# Patient Record
Sex: Male | Born: 1935 | Race: White | Hispanic: No | Smoking: Former smoker
Health system: Southern US, Community
[De-identification: ages and names within clinical notes are randomized; demographics above are authoritative.]

## PROBLEM LIST (undated history)

## (undated) DIAGNOSIS — I8501 Esophageal varices with bleeding: Secondary | ICD-10-CM

## (undated) DIAGNOSIS — K7460 Unspecified cirrhosis of liver: Secondary | ICD-10-CM

## (undated) DIAGNOSIS — K3189 Other diseases of stomach and duodenum: Secondary | ICD-10-CM

## (undated) DIAGNOSIS — K766 Portal hypertension: Secondary | ICD-10-CM

## (undated) DIAGNOSIS — I639 Cerebral infarction, unspecified: Secondary | ICD-10-CM

## (undated) DIAGNOSIS — N189 Chronic kidney disease, unspecified: Secondary | ICD-10-CM

## (undated) DIAGNOSIS — I219 Acute myocardial infarction, unspecified: Secondary | ICD-10-CM

## (undated) DIAGNOSIS — I10 Essential (primary) hypertension: Secondary | ICD-10-CM

## (undated) HISTORY — DX: Other diseases of stomach and duodenum: K31.89

## (undated) HISTORY — DX: Portal hypertension: K76.6

---

## 1990-04-05 DIAGNOSIS — I639 Cerebral infarction, unspecified: Secondary | ICD-10-CM

## 1990-04-05 HISTORY — DX: Cerebral infarction, unspecified: I63.9

## 1990-04-05 HISTORY — PX: CORONARY ARTERY BYPASS GRAFT: SHX141

## 2001-08-31 ENCOUNTER — Ambulatory Visit (HOSPITAL_BASED_OUTPATIENT_CLINIC_OR_DEPARTMENT_OTHER): Admission: RE | Admit: 2001-08-31 | Discharge: 2001-08-31 | Payer: Self-pay | Admitting: Urology

## 2002-05-15 ENCOUNTER — Ambulatory Visit (HOSPITAL_COMMUNITY): Admission: RE | Admit: 2002-05-15 | Discharge: 2002-05-15 | Payer: Self-pay | Admitting: Cardiology

## 2002-05-15 ENCOUNTER — Encounter: Payer: Self-pay | Admitting: Cardiology

## 2002-09-05 ENCOUNTER — Emergency Department (HOSPITAL_COMMUNITY): Admission: EM | Admit: 2002-09-05 | Discharge: 2002-09-06 | Payer: Self-pay | Admitting: Emergency Medicine

## 2002-09-13 ENCOUNTER — Emergency Department (HOSPITAL_COMMUNITY): Admission: EM | Admit: 2002-09-13 | Discharge: 2002-09-13 | Payer: Self-pay | Admitting: Emergency Medicine

## 2002-11-02 ENCOUNTER — Encounter: Payer: Self-pay | Admitting: Cardiology

## 2002-11-02 ENCOUNTER — Encounter: Admission: RE | Admit: 2002-11-02 | Discharge: 2002-11-02 | Payer: Self-pay | Admitting: Cardiology

## 2002-11-07 ENCOUNTER — Ambulatory Visit (HOSPITAL_COMMUNITY): Admission: RE | Admit: 2002-11-07 | Discharge: 2002-11-07 | Payer: Self-pay | Admitting: Cardiology

## 2004-10-27 ENCOUNTER — Ambulatory Visit (HOSPITAL_COMMUNITY): Admission: RE | Admit: 2004-10-27 | Discharge: 2004-10-27 | Payer: Self-pay | Admitting: Urology

## 2004-12-24 ENCOUNTER — Ambulatory Visit (HOSPITAL_COMMUNITY): Admission: RE | Admit: 2004-12-24 | Discharge: 2004-12-24 | Payer: Self-pay | Admitting: Urology

## 2005-01-05 ENCOUNTER — Ambulatory Visit: Payer: Self-pay | Admitting: Internal Medicine

## 2005-01-14 ENCOUNTER — Ambulatory Visit: Payer: Self-pay | Admitting: Cardiology

## 2005-08-02 ENCOUNTER — Ambulatory Visit (HOSPITAL_COMMUNITY): Admission: RE | Admit: 2005-08-02 | Discharge: 2005-08-02 | Payer: Self-pay | Admitting: Urology

## 2006-01-03 ENCOUNTER — Ambulatory Visit: Payer: Self-pay | Admitting: Internal Medicine

## 2008-10-14 ENCOUNTER — Ambulatory Visit: Payer: Self-pay | Admitting: Internal Medicine

## 2008-10-14 DIAGNOSIS — J449 Chronic obstructive pulmonary disease, unspecified: Secondary | ICD-10-CM

## 2008-10-14 DIAGNOSIS — I219 Acute myocardial infarction, unspecified: Secondary | ICD-10-CM | POA: Insufficient documentation

## 2008-10-14 DIAGNOSIS — J309 Allergic rhinitis, unspecified: Secondary | ICD-10-CM | POA: Insufficient documentation

## 2008-10-14 DIAGNOSIS — I2510 Atherosclerotic heart disease of native coronary artery without angina pectoris: Secondary | ICD-10-CM | POA: Insufficient documentation

## 2008-10-14 DIAGNOSIS — J45909 Unspecified asthma, uncomplicated: Secondary | ICD-10-CM | POA: Insufficient documentation

## 2008-10-14 DIAGNOSIS — E119 Type 2 diabetes mellitus without complications: Secondary | ICD-10-CM | POA: Insufficient documentation

## 2008-10-21 ENCOUNTER — Telehealth (INDEPENDENT_AMBULATORY_CARE_PROVIDER_SITE_OTHER): Payer: Self-pay | Admitting: *Deleted

## 2010-08-21 NOTE — Op Note (Signed)
NAME:  Alex Wise, Alex Wise                ACCOUNT NO.:  [id number]   MEDICAL RECORD NO.:  [id number]          PATIENT TYPE:  AMB   LOCATION:  DAY                          FACILITY:  [facility]   PHYSICIAN:  Excell Seltzer. Annabell Howells, M.D.    DATE OF BIRTH:  [birth year]   DATE OF PROCEDURE:  10/27/2004  DATE OF DISCHARGE:                                 OPERATIVE REPORT   PROCEDURE:  Circumcision.   PREOPERATIVE DIAGNOSIS:  Phimosis with balanitis.   POSTOPERATIVE DIAGNOSIS:  Phimosis with balanitis.   SURGEON:  Dr. Bjorn Pippin.   ANESTHESIA:  MAC and local.   SPECIMEN.:  None.   COMPLICATIONS:  None.   INDICATIONS:  [name] is a 77 year old white male diabetic who presented  with progressive phimosis and balanitis. It was felt that circumcision was  indicated.   FINDINGS AND PROCEDURE:  The patient was given a gram of Ancef, he was taken  to the operating room, replaced on the table in supine position. IV sedation  was given, his genitalia was prepped with Betadine solution, he was draped  in the usual sterile fashion. A penile block was performed with  approximately 20 mL of 50/50 mixture 1% lidocaine and half percent Marcaine.  He then underwent a dorsal and ventral slit followed by resection of the  redundant wings of foreskin. Hemostasis was achieved with the Bovie and the  skin edges were reapproximated using a running 4-0 chromic locked in the  quadrants. The penis was cleaned and a dressing of Xeroform, Kling and Coban  was applied. The patient was taken to the recovery room in stable condition.  There were no complications.       JJW/MEDQ  D:  10/27/2004  T:  10/27/2004  Job:  [id number]   cc:   Barry Dienes. Eloise Harman, M.D.  [address]  [place]  [place] [address]  Fax: [phone]   Dorisann Frames, M.D.  [phone] [address] [place] [address]  Fax: [phone]   Thereasa Solo. Little, M.D.  [address]  [address]  [place]  [place] [address]  Fax: [phone]

## 2010-08-21 NOTE — Cardiovascular Report (Signed)
NAME:  Alex, Wise                          ACCOUNT NO.:  [id number]   MEDICAL RECORD NO.:  [id number]                   PATIENT TYPE:  OIB   LOCATION:  2899                                 FACILITY:  [facility]   PHYSICIAN:  Thereasa Solo. Little, M.D.              DATE OF BIRTH:  [birth year]   DATE OF PROCEDURE:  11/07/2002  DATE OF DISCHARGE:                              CARDIAC CATHETERIZATION   INDICATIONS:  Alex Wise is a 77 year old male who had bypass surgery  performed by Alex Wise. Alex Wise, M.D., in 1993.  He presented about eight  months ago with recurrent angina, refused any type of evaluation, finally  had a stress test performed that showed inferior lateral ischemia about four  months ago.  He refused to have a catheterization done and then began having  recurrent episodes of exertional angina, has new T-wave inversion in the  lateral leads, and comes in four an outpatient cardiac catheterization.   DESCRIPTION OF PROCEDURE:  The patient was prepped and draped in the usual  sterile fashion, exposing the right groin.  Following local anesthetic of 1%  xylocaine, the Seldinger technique was employed and a 5-French introducer  sheath was placed into the right femoral artery.  A Glidewire and a right  coronary catheter were used to navigate the descending aorta.  Evaluation of  the grafts using the right coronary artery right Judkins catheter was  performed.  A Williams right had to be used to cannulate the right coronary  artery, internal mammary artery, an IMA catheter was used to cannulate the  left internal mammary, and a standard Judkins left 4 was used for selective  left coronary evaluation.  Ventriculography and a distal aortogram were  performed.   Total contrast 245 mL.   COMPLICATIONS:  None.   HEMODYNAMIC MONITORING:  1. Central aortic pressure was 140/79.  2. Left ventricle pressure was 145/7 with no significant aortic valve     gradient noted.   VENTRICULOGRAPHY:  Ventriculography in the RAO projection revealed posterior  basilar segment to be akinetic.  The inferior wall was mildly hypokinetic  and the anterior and apical segments showed normal wall motion.  Ejection  fraction calculated at 48%.  The end diastolic pressure was 15.   DISTAL AORTOGRAM:  Because of mild irregularities noted throughout the  distal aorta as I tried to pass the catheters, the distal aortogram was  performed.  There were mild irregularities in the distal aorta.  There was  no abdominal aortic aneurysm.  There was no significant iliac disease.  The  left renal artery had an area of 30% narrowing.   CORONARY ARTERIOGRAPHY:  Dense calcification in the entire LAD, left main  was noted on fluoroscopy.   1. Left main: There was ostial 50% narrowing of the left main with the     distal showing an eccentric 80% area of narrowing.  2. Circumflex: The  circumflex in the AV groove was very torturous.  There     was one small marginal vessel noted that paralled the circumflex and the     AV groove.  It, too, was tortuous, small in diameter, and had mild     irregularities.  I could not tell if this was a grafted vessel.  I saw no     evidence of bidirectional flow.  The other OM, however, that was faintly     visualized clearly was grafted and had some bidirectional flow.  3. LAD: The LAD had a long area of 70-90% narrowing in its proximal segment.     The LAD was 100% occluded after a septal perforator with some faint     bidirectional flow.  The first septal perforator had a 60% area of ostial     narrowing.  There was a large left to right collateral bed noted with the     PDA being well visualized via the collaterals.   GRAFTS:  1. Saphenous vein graft to the PDA, RCA, and acute marginal vessel was 100%     occluded in its ostium.  2. Saphenous vein graft to the intermediate OM vessel: The graft was widely     patent with mild irregularities at the distal  portion.  The OM and     intermediate vessels were both small with diffuse proximal irregularities     and a small OM vessel.  3. Internal mammary artery to the LAD and diagonal: The internal mammary     artery was tortuous.  There was no high grade stenosis.  The LAD and     diagonals had mild irregularities and the diagonal, itself, was small.   CONCLUSIONS:  1. Severe native disease not amenable to any type of intervention.  2. Loss of one of the three bypass grafts which supply the inferior wall.     There is clearly an inferior wall motion abnormality present; however,     there is a collateral bed to the patent ductus arteriosus from the left     coronary circulation.   PLAN:  At this point, the patient needs aggressive medical therapy and  perhaps, EECP.  There is nothing that can be done from a percutaneous  standpoint.                                               Thereasa Solo. Little, M.D.    ABL/MEDQ  D:  11/07/2002  T:  11/07/2002  Job:  [id number]

## 2011-07-20 ENCOUNTER — Encounter (HOSPITAL_COMMUNITY): Payer: Self-pay | Admitting: *Deleted

## 2011-07-20 ENCOUNTER — Emergency Department (HOSPITAL_COMMUNITY): Payer: Medicare Other

## 2011-07-20 ENCOUNTER — Emergency Department (HOSPITAL_COMMUNITY)
Admission: EM | Admit: 2011-07-20 | Discharge: 2011-07-20 | Disposition: A | Discharge status: Home / Self Care | Payer: Medicare Other | Attending: Emergency Medicine | Admitting: Emergency Medicine

## 2011-07-20 DIAGNOSIS — I10 Essential (primary) hypertension: Secondary | ICD-10-CM | POA: Insufficient documentation

## 2011-07-20 DIAGNOSIS — Z8673 Personal history of transient ischemic attack (TIA), and cerebral infarction without residual deficits: Secondary | ICD-10-CM | POA: Insufficient documentation

## 2011-07-20 DIAGNOSIS — E119 Type 2 diabetes mellitus without complications: Secondary | ICD-10-CM | POA: Insufficient documentation

## 2011-07-20 DIAGNOSIS — Z794 Long term (current) use of insulin: Secondary | ICD-10-CM | POA: Insufficient documentation

## 2011-07-20 DIAGNOSIS — R51 Headache: Secondary | ICD-10-CM | POA: Insufficient documentation

## 2011-07-20 DIAGNOSIS — J45909 Unspecified asthma, uncomplicated: Secondary | ICD-10-CM | POA: Insufficient documentation

## 2011-07-20 DIAGNOSIS — M542 Cervicalgia: Secondary | ICD-10-CM | POA: Insufficient documentation

## 2011-07-20 HISTORY — DX: Cerebral infarction, unspecified: I63.9

## 2011-07-20 HISTORY — DX: Essential (primary) hypertension: I10

## 2011-07-20 MED ORDER — ACETAMINOPHEN 500 MG PO TABS: 500.0000 mg | ORAL_TABLET | Freq: Four times a day (QID) | ORAL | Status: AC | PRN

## 2011-07-20 MED ORDER — CYCLOBENZAPRINE HCL 10 MG PO TABS: 10.0000 mg | ORAL_TABLET | Freq: Two times a day (BID) | ORAL | Status: AC | PRN
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
## 2011-07-20 NOTE — ED Notes (Signed)
The pt is c/o the wait threatening to leave if he is not seen.  He attempts to joke then becomes angry
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
## 2011-07-21 NOTE — ED Provider Notes (Signed)
Medical screening examination/treatment/procedure(s) were performed by non-physician practitioner and as supervising physician I was immediately available for consultation/collaboration.   Dione Booze, MD 07/21/11 [phone]

## 2012-03-26 ENCOUNTER — Inpatient Hospital Stay (HOSPITAL_COMMUNITY)
Admission: EM | Admit: 2012-03-26 | Discharge: 2012-03-30 | DRG: 441 | Disposition: A | Discharge status: Home / Self Care | Payer: Medicare Other | Attending: Internal Medicine | Admitting: Internal Medicine

## 2012-03-26 ENCOUNTER — Encounter (HOSPITAL_COMMUNITY): Payer: Self-pay

## 2012-03-26 DIAGNOSIS — J449 Chronic obstructive pulmonary disease, unspecified: Secondary | ICD-10-CM

## 2012-03-26 DIAGNOSIS — Z951 Presence of aortocoronary bypass graft: Secondary | ICD-10-CM

## 2012-03-26 DIAGNOSIS — I2510 Atherosclerotic heart disease of native coronary artery without angina pectoris: Secondary | ICD-10-CM | POA: Diagnosis present

## 2012-03-26 DIAGNOSIS — J45909 Unspecified asthma, uncomplicated: Secondary | ICD-10-CM | POA: Diagnosis present

## 2012-03-26 DIAGNOSIS — I8511 Secondary esophageal varices with bleeding: Secondary | ICD-10-CM | POA: Diagnosis present

## 2012-03-26 DIAGNOSIS — D62 Acute posthemorrhagic anemia: Secondary | ICD-10-CM | POA: Diagnosis present

## 2012-03-26 DIAGNOSIS — D696 Thrombocytopenia, unspecified: Secondary | ICD-10-CM | POA: Diagnosis present

## 2012-03-26 DIAGNOSIS — E119 Type 2 diabetes mellitus without complications: Secondary | ICD-10-CM | POA: Diagnosis present

## 2012-03-26 DIAGNOSIS — K7460 Unspecified cirrhosis of liver: Secondary | ICD-10-CM | POA: Diagnosis present

## 2012-03-26 DIAGNOSIS — I252 Old myocardial infarction: Secondary | ICD-10-CM

## 2012-03-26 DIAGNOSIS — K766 Portal hypertension: Principal | ICD-10-CM | POA: Diagnosis present

## 2012-03-26 DIAGNOSIS — I8501 Esophageal varices with bleeding: Secondary | ICD-10-CM | POA: Diagnosis present

## 2012-03-26 DIAGNOSIS — I219 Acute myocardial infarction, unspecified: Secondary | ICD-10-CM

## 2012-03-26 DIAGNOSIS — K922 Gastrointestinal hemorrhage, unspecified: Secondary | ICD-10-CM | POA: Diagnosis present

## 2012-03-26 LAB — OCCULT BLOOD, POC DEVICE: Fecal Occult Bld: POSITIVE — AB

## 2012-03-26 LAB — COMPREHENSIVE METABOLIC PANEL
Alkaline Phosphatase: 50 U/L (ref 39–117)
BUN: 66 mg/dL — ABNORMAL HIGH (ref 6–23)
CO2: 21 mEq/L (ref 19–32)
Chloride: 97 mEq/L (ref 96–112)
Creatinine, Ser: 1.1 mg/dL (ref 0.50–1.35)
GFR calc Af Amer: 72 mL/min — ABNORMAL LOW (ref >90)
GFR calc non Af Amer: 62 mL/min — ABNORMAL LOW (ref >90)
Glucose, Bld: 292 mg/dL — ABNORMAL HIGH (ref 70–99)
Total Bilirubin: 0.9 mg/dL (ref 0.3–1.2)

## 2012-03-26 MED ORDER — SODIUM CHLORIDE 0.9 % IV BOLUS (SEPSIS)
1000.0000 mL | Freq: Once | INTRAVENOUS | Status: AC
  Administered 2012-03-26: 1000 mL via INTRAVENOUS

## 2012-03-26 MED ORDER — SODIUM CHLORIDE 0.9 % IV SOLN
80.0000 mg | Freq: Once | INTRAVENOUS | Status: AC
  Administered 2012-03-26: 80 mg via INTRAVENOUS
  Filled 2012-03-26: qty 80

## 2012-03-26 NOTE — ED Notes (Signed)
Bed:WA15<BR> Expected date:<BR> Expected time:<BR> Means of arrival:<BR> Comments:<BR> EMS
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
## 2012-03-26 NOTE — ED Provider Notes (Signed)
History     CSN: [id number]  Arrival date & time 03/26/12  2142   First MD Initiated Contact with Patient 03/26/12 2208      Chief Complaint  Patient presents with  . GI Bleeding   HPI  History provided by the patient. Patient is a 78 year old male with history of hypertension and diabetes who presents with complaints of dark black stool and episodes of vomiting with hematemesis. Patient reports having 2 dark black stools earlier today and later this afternoon and evening developed nausea vomiting. He reports having dark red blood clots in the emesis. Symptoms have been associated with some mild epigastric pain and burning. Patient denies having similar symptoms previously. Patient denies any current use of blood thinners or aspirin. Denies any recent reflux symptoms. Denies any belching. Symptoms have been associated with some lightheadedness and chills. Denies any fever, diaphoresis or syncope. Denies any chest pain or shortness of breath.   Past Medical History  Diagnosis Date  . Diabetes mellitus   . Hypertension   . Stroke   . Asthma     History reviewed. No pertinent past surgical history.  History reviewed. No pertinent family history.  History  Substance Use Topics  . Smoking status: Never Smoker   . Smokeless tobacco: Not on file  . Alcohol Use: No      Review of Systems  Constitutional: Positive for chills. Negative for fever and fatigue.  Respiratory: Negative for shortness of breath.   Cardiovascular: Negative for chest pain.  Gastrointestinal: Positive for nausea, vomiting, abdominal pain and blood in stool. Negative for diarrhea, constipation and rectal pain.  Neurological: Positive for light-headedness.  All other systems reviewed and are negative.    Allergies  Codeine  Home Medications   Current Outpatient Rx  Name  Route  Sig  Dispense  Refill  . VITAMIN D3 2000 UNITS PO TABS   Oral   Take 1 tablet by mouth daily.         . INSULIN  ISOPHANE & REGULAR (70-30) 100 UNIT/ML [place] SUSP   Subcutaneous   Inject 50 Units into the skin 2 (two) times daily with a meal.           BP 126/54  Pulse 114  Temp 98.8 F (37.1 C) (Oral)  Resp 16  SpO2 93%  Physical Exam  Nursing note and vitals reviewed. Constitutional: He is oriented to person, place, and time. He appears well-developed and well-nourished. No distress.  HENT:  Head: Normocephalic.       Oral mucosa dry  Cardiovascular: Regular rhythm.  Tachycardia present.   No murmur heard. Pulmonary/Chest: Effort normal and breath sounds normal. No respiratory distress. He has no wheezes. He has no rales.  Abdominal: Soft. There is no tenderness. There is no rebound and no guarding.  Genitourinary: Guaiac positive stool.  Neurological: He is alert and oriented to person, place, and time.  Skin: Skin is warm and dry. There is pallor.  Psychiatric: He has a normal mood and affect. His behavior is normal.    ED Course  Procedures   Results for orders placed during the hospital encounter of 03/26/12  CBC WITH DIFFERENTIAL      Component Value Range   WBC 13.8 (*) 4.0 - 10.5 K/uL   RBC 3.43 (*) 4.22 - 5.81 MIL/uL   Hemoglobin 10.9 (*) 13.0 - 17.0 g/dL   HCT 40.9 (*) 81.1 - 91.4 %   MCV 93.3  78.0 - 100.0 fL  MCH 31.8  26.0 - 34.0 pg   MCHC 34.1  30.0 - 36.0 g/dL   RDW 14.7  82.9 - 56.2 %   Platelets 108 (*) 150 - 400 K/uL   Neutrophils Relative 80 (*) 43 - 77 %   Lymphocytes Relative 15  12 - 46 %   Monocytes Relative 4  3 - 12 %   Eosinophils Relative 0  0 - 5 %   Basophils Relative 1  0 - 1 %   Neutro Abs 11.0 (*) 1.7 - 7.7 K/uL   Lymphs Abs 2.1  0.7 - 4.0 K/uL   Monocytes Absolute 0.6  0.1 - 1.0 K/uL   Eosinophils Absolute 0.0  0.0 - 0.7 K/uL   Basophils Absolute 0.1  0.0 - 0.1 K/uL   Smear Review MORPHOLOGY UNREMARKABLE    COMPREHENSIVE METABOLIC PANEL      Component Value Range   Sodium 134 (*) 135 - 145 mEq/L   Potassium 4.8  3.5 - 5.1 mEq/L    Chloride 97  96 - 112 mEq/L   CO2 21  19 - 32 mEq/L   Glucose, Bld 292 (*) 70 - 99 mg/dL   BUN 66 (*) 6 - 23 mg/dL   Creatinine, Ser 1.30  0.50 - 1.35 mg/dL   Calcium 9.4  8.4 - 86.5 mg/dL   Total Protein 6.6  6.0 - 8.3 g/dL   Albumin 3.4 (*) 3.5 - 5.2 g/dL   AST 22  0 - 37 U/L   ALT 15  0 - 53 U/L   Alkaline Phosphatase 50  39 - 117 U/L   Total Bilirubin 0.9  0.3 - 1.2 mg/dL   GFR calc non Af Amer 62 (*) >90 mL/min   GFR calc Af Amer 72 (*) >90 mL/min  OCCULT BLOOD, POC DEVICE      Component Value Range   Fecal Occult Bld POSITIVE (*) NEGATIVE         1. Upper GI bleed       MDM  10:20PM patient seen and evaluated. Patient resting comfortably in no acute distress. Patient is mildly tachycardic at rest.  Patient seen and discussed with attending physician. Given age and continued tachycardia for admit for further workup of GI bleed  Spoke with [facility] hospital[facility]. They'll see patient and admit.  Poke with Dr. Loreta Ave with GI. She is taken patient's name and will follow in the hospital.   Date: 03/26/2012  Rate: 111  Rhythm: sinus tachycardia  QRS Axis: normal  Intervals: normal  ST/T Wave abnormalities: nonspecific ST/T changes  Conduction Disutrbances:none  Narrative Interpretation:   Old EKG Reviewed: changes noted from 08/24/2001. Tachycardia new.     Angus Seller, [place] 03/27/12 [phone]

## 2012-03-27 ENCOUNTER — Encounter (HOSPITAL_COMMUNITY)
Admission: EM | Disposition: A | Discharge status: Home / Self Care | Payer: Self-pay | Source: Home / Self Care | Attending: Internal Medicine

## 2012-03-27 ENCOUNTER — Encounter (HOSPITAL_COMMUNITY): Payer: Self-pay | Admitting: Internal Medicine

## 2012-03-27 DIAGNOSIS — D696 Thrombocytopenia, unspecified: Secondary | ICD-10-CM | POA: Diagnosis present

## 2012-03-27 DIAGNOSIS — D62 Acute posthemorrhagic anemia: Secondary | ICD-10-CM

## 2012-03-27 DIAGNOSIS — I8501 Esophageal varices with bleeding: Secondary | ICD-10-CM | POA: Diagnosis present

## 2012-03-27 DIAGNOSIS — I219 Acute myocardial infarction, unspecified: Secondary | ICD-10-CM

## 2012-03-27 DIAGNOSIS — E119 Type 2 diabetes mellitus without complications: Secondary | ICD-10-CM

## 2012-03-27 DIAGNOSIS — K922 Gastrointestinal hemorrhage, unspecified: Secondary | ICD-10-CM | POA: Diagnosis present

## 2012-03-27 HISTORY — DX: Esophageal varices with bleeding: I85.01

## 2012-03-27 HISTORY — PX: ESOPHAGOGASTRODUODENOSCOPY: SHX5428

## 2012-03-27 LAB — CBC WITH DIFFERENTIAL/PLATELET
Basophils Absolute: 0.1 K/uL (ref 0.0–0.1)
Basophils Relative: 1 % (ref 0–1)
Eosinophils Absolute: 0 K/uL (ref 0.0–0.7)
Eosinophils Relative: 0 % (ref 0–5)
HCT: 32 % — ABNORMAL LOW (ref 39.0–52.0)
Hemoglobin: 10.9 g/dL — ABNORMAL LOW (ref 13.0–17.0)
Lymphocytes Relative: 15 % (ref 12–46)
Lymphs Abs: 2.1 K/uL (ref 0.7–4.0)
MCH: 31.8 pg (ref 26.0–34.0)
MCHC: 34.1 g/dL (ref 30.0–36.0)
MCV: 93.3 fL (ref 78.0–100.0)
Monocytes Absolute: 0.6 K/uL (ref 0.1–1.0)
Monocytes Relative: 4 % (ref 3–12)
Neutro Abs: 11 K/uL — ABNORMAL HIGH (ref 1.7–7.7)
Neutrophils Relative %: 80 % — ABNORMAL HIGH (ref 43–77)
Platelets: 108 K/uL — ABNORMAL LOW (ref 150–400)
RBC: 3.43 MIL/uL — ABNORMAL LOW (ref 4.22–5.81)
RDW: 13.8 % (ref 11.5–15.5)
WBC: 13.8 K/uL — ABNORMAL HIGH (ref 4.0–10.5)

## 2012-03-27 LAB — GLUCOSE, CAPILLARY
Glucose-Capillary: 271 mg/dL — ABNORMAL HIGH (ref 70–99)
Glucose-Capillary: 150 mg/dL — ABNORMAL HIGH (ref 70–99)

## 2012-03-27 LAB — LIPID PANEL
HDL: 29 mg/dL — ABNORMAL LOW (ref >39)
LDL Cholesterol: 71 mg/dL (ref 0–99)
Total CHOL/HDL Ratio: 4.6 RATIO

## 2012-03-27 LAB — HEMOGLOBIN A1C
Hgb A1c MFr Bld: 7.9 % — ABNORMAL HIGH (ref <5.7)
Mean Plasma Glucose: 180 mg/dL — ABNORMAL HIGH (ref <117)

## 2012-03-27 LAB — CBC
HCT: 25 % — ABNORMAL LOW (ref 39.0–52.0)
HCT: 22.9 % — ABNORMAL LOW (ref 39.0–52.0)
HCT: 24.3 % — ABNORMAL LOW (ref 39.0–52.0)
Hemoglobin: 8.6 g/dL — ABNORMAL LOW (ref 13.0–17.0)
Hemoglobin: 8.3 g/dL — ABNORMAL LOW (ref 13.0–17.0)
Hemoglobin: 8.8 g/dL — ABNORMAL LOW (ref 13.0–17.0)
MCH: 33.3 pg (ref 26.0–34.0)
MCHC: 36.4 g/dL — ABNORMAL HIGH (ref 30.0–36.0)
MCHC: 36.2 g/dL — ABNORMAL HIGH (ref 30.0–36.0)
MCV: 91.6 fL (ref 78.0–100.0)
Platelets: 78 10*3/uL — ABNORMAL LOW (ref 150–400)
Platelets: 71 10*3/uL — ABNORMAL LOW (ref 150–400)
RBC: 2.61 MIL/uL — ABNORMAL LOW (ref 4.22–5.81)
RBC: 2.5 MIL/uL — ABNORMAL LOW (ref 4.22–5.81)
RBC: 2.64 MIL/uL — ABNORMAL LOW (ref 4.22–5.81)
RDW: 13.6 % (ref 11.5–15.5)
WBC: 11.8 10*3/uL — ABNORMAL HIGH (ref 4.0–10.5)
WBC: 11.4 10*3/uL — ABNORMAL HIGH (ref 4.0–10.5)
WBC: 11.8 10*3/uL — ABNORMAL HIGH (ref 4.0–10.5)

## 2012-03-27 LAB — IRON AND TIBC
Iron: 228 ug/dL — ABNORMAL HIGH (ref 42–135)
UIBC: <15 ug/dL — ABNORMAL LOW (ref 125–400)

## 2012-03-27 LAB — ABO/RH: ABO/RH(D): B POS

## 2012-03-27 LAB — FERRITIN: Ferritin: 482 ng/mL — ABNORMAL HIGH (ref 22–322)

## 2012-03-27 LAB — PROTIME-INR
INR: 1.23 (ref 0.00–1.49)
Prothrombin Time: 15.3 s — ABNORMAL HIGH (ref 11.6–15.2)

## 2012-03-27 SURGERY — EGD (ESOPHAGOGASTRODUODENOSCOPY)
Anesthesia: Moderate Sedation

## 2012-03-27 MED ORDER — SODIUM CHLORIDE 0.9 % IV SOLN: INTRAVENOUS | Status: DC

## 2012-03-27 MED ORDER — METOPROLOL TARTRATE 25 MG PO TABS
25.0000 mg | ORAL_TABLET | Freq: Two times a day (BID) | ORAL | Status: DC
  Administered 2012-03-27 (×2): 25 mg via ORAL
  Filled 2012-03-27 (×4): qty 1

## 2012-03-27 MED ORDER — GLYCOPYRROLATE 0.2 MG/ML IJ SOLN
INTRAMUSCULAR | Status: DC | PRN
  Administered 2012-03-27: 0.2 mg via INTRAVENOUS

## 2012-03-27 MED ORDER — FENTANYL CITRATE 0.05 MG/ML IJ SOLN
INTRAMUSCULAR | Status: DC | PRN
  Administered 2012-03-27: 25 ug via INTRAVENOUS

## 2012-03-27 MED ORDER — BUTAMBEN-TETRACAINE-BENZOCAINE 2-2-14 % EX AERO
INHALATION_SPRAY | CUTANEOUS | Status: DC | PRN
  Administered 2012-03-27: 2 via TOPICAL

## 2012-03-27 MED ORDER — FENTANYL CITRATE 0.05 MG/ML IJ SOLN
INTRAMUSCULAR | Status: AC
  Filled 2012-03-27: qty 2

## 2012-03-27 MED ORDER — PANTOPRAZOLE SODIUM 40 MG IV SOLR
40.0000 mg | INTRAVENOUS | Status: DC
  Administered 2012-03-27 – 2012-03-29 (×3): 40 mg via INTRAVENOUS
  Filled 2012-03-27 (×4): qty 40

## 2012-03-27 MED ORDER — GLYCOPYRROLATE 0.2 MG/ML IJ SOLN
INTRAMUSCULAR | Status: AC
  Filled 2012-03-27: qty 1

## 2012-03-27 MED ORDER — INSULIN GLARGINE 100 UNIT/ML ~~LOC~~ SOLN
10.0000 [IU] | Freq: Two times a day (BID) | SUBCUTANEOUS | Status: DC
  Administered 2012-03-27 – 2012-03-30 (×7): 10 [IU] via SUBCUTANEOUS

## 2012-03-27 MED ORDER — SODIUM CHLORIDE 0.9 % IJ SOLN
3.0000 mL | Freq: Two times a day (BID) | INTRAMUSCULAR | Status: DC
  Administered 2012-03-29: 3 mL via INTRAVENOUS

## 2012-03-27 MED ORDER — ONDANSETRON HCL 4 MG/2ML IJ SOLN: 4.0000 mg | Freq: Four times a day (QID) | INTRAMUSCULAR | Status: DC | PRN

## 2012-03-27 MED ORDER — MIDAZOLAM HCL 10 MG/2ML IJ SOLN
INTRAMUSCULAR | Status: AC
  Filled 2012-03-27: qty 2

## 2012-03-27 MED ORDER — MIDAZOLAM HCL 10 MG/2ML IJ SOLN
INTRAMUSCULAR | Status: DC | PRN
  Administered 2012-03-27 (×2): 1 mg via INTRAVENOUS
  Administered 2012-03-27: 2 mg via INTRAVENOUS

## 2012-03-27 MED ORDER — INSULIN ASPART 100 UNIT/ML ~~LOC~~ SOLN
0.0000 [IU] | SUBCUTANEOUS | Status: DC
  Administered 2012-03-27: 2 [IU] via SUBCUTANEOUS
  Administered 2012-03-27: 8 [IU] via SUBCUTANEOUS
  Administered 2012-03-27: 3 [IU] via SUBCUTANEOUS
  Administered 2012-03-27: 8 [IU] via SUBCUTANEOUS
  Administered 2012-03-27: 3 [IU] via SUBCUTANEOUS
  Administered 2012-03-28: 2 [IU] via SUBCUTANEOUS
  Administered 2012-03-28: 3 [IU] via SUBCUTANEOUS
  Administered 2012-03-28 (×2): 2 [IU] via SUBCUTANEOUS
  Administered 2012-03-28 (×2): 3 [IU] via SUBCUTANEOUS
  Administered 2012-03-29: 2 [IU] via SUBCUTANEOUS
  Administered 2012-03-29 (×4): 3 [IU] via SUBCUTANEOUS
  Administered 2012-03-29 – 2012-03-30 (×3): 2 [IU] via SUBCUTANEOUS
  Administered 2012-03-30: 3 [IU] via SUBCUTANEOUS

## 2012-03-27 MED ORDER — HYDROCORTISONE 1 % EX CREA
TOPICAL_CREAM | Freq: Two times a day (BID) | CUTANEOUS | Status: DC
  Administered 2012-03-27 – 2012-03-29 (×5): via TOPICAL
  Filled 2012-03-27: qty 28

## 2012-03-27 MED ORDER — DIPHENHYDRAMINE HCL 25 MG PO CAPS
25.0000 mg | ORAL_CAPSULE | Freq: Four times a day (QID) | ORAL | Status: DC | PRN
  Administered 2012-03-27 (×2): 25 mg via ORAL
  Filled 2012-03-27 (×2): qty 1

## 2012-03-27 MED ORDER — METOPROLOL TARTRATE 1 MG/ML IV SOLN: 5.0000 mg | Freq: Three times a day (TID) | INTRAVENOUS | Status: DC | PRN

## 2012-03-27 MED ORDER — ONDANSETRON HCL 4 MG PO TABS: 4.0000 mg | ORAL_TABLET | Freq: Four times a day (QID) | ORAL | Status: DC | PRN

## 2012-03-27 MED ORDER — DEXTROSE-NACL 5-0.9 % IV SOLN
INTRAVENOUS | Status: DC
  Administered 2012-03-27: 50 mL/h via INTRAVENOUS

## 2012-03-27 MED ORDER — SODIUM CHLORIDE 0.9 % IV SOLN
INTRAVENOUS | Status: DC
  Administered 2012-03-27: 22:00:00 via INTRAVENOUS
  Administered 2012-03-27: 50 mL/h via INTRAVENOUS

## 2012-03-27 MED ORDER — SODIUM CHLORIDE 0.9 % IV SOLN
8.0000 mg/h | INTRAVENOUS | Status: DC
  Administered 2012-03-27: 8 mg/h via INTRAVENOUS
  Filled 2012-03-27 (×4): qty 80

## 2012-03-27 NOTE — Op Note (Signed)
[facility] [address] [place], [address]   ENDOSCOPY PROCEDURE REPORT  PATIENT: Alex, Wise  MR#: [id number] BIRTHDATE: [birth year] , 80  yrs. old GENDER: Male ENDOSCOPIST: Louis Meckel, MD REFERRED BY: PROCEDURE DATE:  03/27/2012 PROCEDURE:  EGD w/ band ligation of varices ASA CLASS:     Class III INDICATIONS:  Hematemesis. MEDICATIONS: These medications were titrated to patient response per physician's verbal order, Versed 4 mg IV, Fentanyl 25 mcg IV, and Robinul 0.2 mg IV TOPICAL ANESTHETIC: Cetacaine Spray  DESCRIPTION OF PROCEDURE: After the risks benefits and alternatives of the procedure were thoroughly explained, informed consent was obtained.  The    endoscope was introduced through the mouth and advanced to the third portion of the duodenum. Without limitations. The instrument was slowly withdrawn as the mucosa was fully examined.        ESOPHAGUS: There were 3 columns of varices in the distal and middle third of the esophagus.  The were and there was evidence of a red wale sign.  There were multiple gray for varices throughout the lower one half of the esophagus.  There was no fresh or old blood. Band ligation x 5 was performed.  STOMACH: Mild portal hypertensive gastropathy was found.  The remainder of the upper endoscopy exam was otherwise normal. Retroflexed views revealed no abnormalities.     The scope was then withdrawn from the patient and the procedure completed.  COMPLICATIONS: There were no complications. ENDOSCOPIC IMPRESSION: 1.  grade 4 esophageal varices in lower one half of the esophagus-status post band ligation 2.  portal hypertensive gastropathy  RECOMMENDATIONS: IV Protonix REPEAT EXAM: followup endoscopy in 2 weeks  eSigned:  Louis Meckel, MD 03/27/2012 2:29 PM   CC:  PATIENT NAME:  Alex, Wise MR#: [id number]

## 2012-03-27 NOTE — Progress Notes (Addendum)
EGD shows grade 4 esophageal varices and portal hypertensive gastropathy.  Varices were banded.    Recomment 1.  Serologies for hep B and C, Fe TIBC, ferritin 2.  Change to protonix IV qd 3.  F/u EGD 2 weeks 4.  No need for octreotide - no longer bleeding 5.  Begin nadolol 40mg  qd 6.  Abdominal ultrasound

## 2012-03-27 NOTE — ED Notes (Signed)
PA at bedside.

## 2012-03-27 NOTE — Consult Note (Signed)
Referring Provider: No ref. provider found Primary Care Physician:  No primary provider on file. Primary Gastroenterologist:  Gentry Fitz  Reason for Consultation:  UGIB  HPI: Alex Wise is a 78 y.o. male with hx of CAD, several MI's, s/p CABG, not on ASA, DM on insulin, presents to the ER with episodes of vomiting blood and having black stool. This all began yesterday AM.  Had a large amount of hematemesis and then started passing black stools.  Ignored his symptoms all day until he vomited blood with clots again later in the evening, then came to the ED.  He denied abdominal pain, fever, chills, chest pain or lightheadedness. No difficult or painful swallowing.  Appetite has been fair with no significant weight loss.  He takes no advil, aleve, ASA, or any other meds except for his insulin.  Rarely takes [facility] for headache, but nothing recently.  Was supposed to be taking a blood thinner of some sort, but stopped it and has not been taking it for a long time.  Evaluation in the ER showed Hgb of 10.9 grams/DL, elevated BUN to 66, with normal Cr.   This morning Hgb is 8.6 grams.  He has not required any blood transfusion.  We do not have a baseline Hgb prior to admission.  No vomiting or passing of stools since admission.  He is on a PPI gtt.  No known history of liver disease.  INR is 1.23.  Denies ETOH abuse.   Past Medical History  Diagnosis Date  . Diabetes mellitus   . Hypertension   . Stroke   . Asthma     Past Surgical History  Procedure Date  . Coronary artery bypass graft     Prior to Admission medications   Medication Sig Start Date End Date Taking? Authorizing Provider  insulin NPH-insulin regular (NOVOLIN 70/30) (70-30) 100 UNIT/ML injection Inject 60 Units into the skin 2 (two) times daily with a meal.    Yes Historical Provider, MD    Current Facility-Administered Medications  Medication Dose Route Frequency Provider Last Rate Last Dose  . 0.9 %  sodium chloride infusion    Intravenous Continuous Ron Parker, MD 50 mL/hr at 03/27/12 0425 50 mL/hr at 03/27/12 0425  . diphenhydrAMINE (BENADRYL) capsule 25 mg  25 mg Oral Q6H PRN Dorothea Ogle, MD      . hydrocortisone cream 1 %   Topical BID Dorothea Ogle, MD      . insulin aspart (novoLOG) injection 0-15 Units  0-15 Units Subcutaneous Q4H Houston Siren, MD   8 Units at 03/27/12 0415  . insulin glargine (LANTUS) injection 10 Units  10 Units Subcutaneous BID Houston Siren, MD   10 Units at 03/27/12 0258  . ondansetron (ZOFRAN) tablet 4 mg  4 mg Oral Q6H PRN Houston Siren, MD       Or  . ondansetron [facility]) injection 4 mg  4 mg Intravenous Q6H PRN Houston Siren, MD      . pantoprazole (PROTONIX) 80 mg in sodium chloride 0.9 % 250 mL infusion  8 mg/hr Intravenous Continuous Houston Siren, MD 25 mL/hr at 03/27/12 0254 8 mg/hr at 03/27/12 0254  . sodium chloride 0.9 % injection 3 mL  3 mL Intravenous Q12H Houston Siren, MD        Allergies as of 03/26/2012 - Review Complete 03/26/2012  Allergen Reaction Noted  . Codeine Nausea And Vomiting     History reviewed. No pertinent family history.  History  Social History  . Marital Status: Married    Spouse Name: N/A    Number of Children: N/A  . Years of Education: N/A   Occupational History  . Not on file.   Social History Main Topics  . Smoking status: Never Smoker   . Smokeless tobacco: Not on file  . Alcohol Use: No  . Drug Use: No  . Sexually Active:    Other Topics Concern  . Not on file   Social History Narrative  . No narrative on file    Review of Systems: Ten point ROS is O/W negative except as mentioned in HPI.  Physical Exam: Vital signs in last 24 hours: Temp:  [98.8 F (37.1 C)-99.5 F (37.5 C)] 99 F (37.2 C) (12/23 0800) Pulse Rate:  [107-119] 108  (12/23 0800) Resp:  [14-20] 15  (12/23 0800) BP: (98-152)/(47-76) 116/55 mmHg (12/23 0800) SpO2:  [93 %-99 %] 99 % (12/23 0800) Weight:  [194 lb 7.1 oz (88.2 kg)] 194 lb 7.1 oz (88.2 kg) (12/23  0700) Last BM Date: 03/26/12 General:   Alert,  Well-developed, well-nourished, pleasant and cooperative in NAD. Head:  Normocephalic and atraumatic. Eyes:  Sclera clear, no icterus.  Conjunctiva pink. Ears:  Normal auditory acuity. Lungs:  Clear throughout to auscultation.   No wheezes, crackles, or rhonchi.  Heart:  Tachy but regular rhythm; no murmurs, clicks, rubs,  or gallops. Abdomen:  Soft, nontender, BS active, nonpalp mass or hsm.   Rectal:  Deferred  Msk:  Symmetrical without gross deformities. Pulses:  Normal pulses noted. Extremities:  Without clubbing or edema. Neurologic:  Alert and  oriented x4;  grossly normal neurologically. Skin:  Intact without significant lesions or rashes.  Some dry skin noted on legs. Psych:  Alert and cooperative. Normal mood and affect.  Intake/Output from previous day: 12/22 0701 - 12/23 0700 In: 702.5 [P.O.:480; I.V.:222.5] Out: 225 [Urine:225]  Lab Results:  [facility] 03/27/12 0525 03/27/12 0113 03/26/12 2209  WBC 11.4* 11.8* 13.8*  HGB 8.6* 9.1* 10.9*  HCT 24.2* 25.0* 32.0*  PLT 71* 78* 108*   BMET  Basename 03/26/12 2209  NA 134*  K 4.8  CL 97  CO2 21  GLUCOSE 292*  BUN 66*  CREATININE 1.10  CALCIUM 9.4   LFT  Basename 03/26/12 2209  PROT 6.6  ALBUMIN 3.4*  AST 22  ALT 15  ALKPHOS 50  BILITOT 0.9  BILIDIR --  IBILI --   PT/INR  Basename 03/27/12 0113  LABPROT 15.3*  INR 1.23    IMPRESSION:  -UGIB:  With hematemesis, melena, and elevated BUN.  Rule out PUD vs AVM vs Dieulafoy vs malignancy and other etiologies. -ABLA:  Unsure of baseline Hgb.  Has not received any blood transfusions at this point. -Uncontrolled insulin dependent DM  PLAN: -EGD today. -Continue PPI gtt for now. -Monitor Hgb and transfuse prn.  ZEHR, JESSICA D.  03/27/2012, 9:34 AM  Pager number [phone]  Chart was reviewed and patient was examined. X-rays were reviewed.    I agree with management and plans. And presents with acute  upper GI bleed. At this point bleeding appears to have clinically stopped. Plan endoscopy for further evaluation and therapy.  Barbette Hair. Arlyce Dice, M.D., [facility] Cell [phone]

## 2012-03-27 NOTE — H&P (Signed)
[facility] History and Physical  Alex Wise NWG:[id number] DOB: 05/15/1931    PCP:   Dr Ramond Craver.  Chief Complaint: vomiting blood and melanotic stool.  HPI: Alex Wise is an 78 y.o. male with hx of  CAD, several MI's, s/p CABG, not on ASA, DM on insulin, presents to the ER with one episode of vomiting blood and having black stool.  He denied abdominal pain, fever, chills, chest pain or lightheadedness.  He takes no advil, aleve, ASA, or any other meds except for his insulin.  Evaluation in the ER showed Hb of 10grams/DL, elevated BUN to 66, with normal cr.  He maintained hemodynamic stability and hospitalist was asked to admit him for upper GI bleed.  Rewiew of Systems:  Constitutional: Negative for malaise, fever and chills. No significant weight loss or weight gain Eyes: Negative for eye pain, redness and discharge, diplopia, visual changes, or flashes of light. ENMT: Negative for ear pain, hoarseness, nasal congestion, sinus pressure and sore throat. No headaches; tinnitus, drooling, or problem swallowing. Cardiovascular: Negative for chest pain, palpitations, diaphoresis, dyspnea and peripheral edema. ; No orthopnea, PND Respiratory: Negative for cough, hemoptysis, wheezing and stridor. No pleuritic chestpain. Gastrointestinal: Negative for nausea, vomiting, diarrhea, constipation, abdominal pain, melena,  and rectal bleeding.    Genitourinary: Negative for frequency, dysuria, incontinence,flank pain and hematuria; Musculoskeletal: Negative for back pain and neck pain. Negative for swelling and trauma.;  Skin: . Negative for pruritus, rash, abrasions, bruising and skin lesion.; ulcerations Neuro: Negative for headache, lightheadedness and neck stiffness. Negative for weakness, altered level of consciousness , altered mental status, extremity weakness, burning feet, involuntary movement, seizure and syncope.  Psych: negative for anxiety, depression, insomnia, tearfulness,  panic attacks, hallucinations, paranoia, suicidal or homicidal ideation    Past Medical History  Diagnosis Date  . Diabetes mellitus   . Hypertension   . Stroke   . Asthma     History reviewed. No pertinent past surgical history.  Medications:  HOME MEDS: Prior to Admission medications   Medication Sig Start Date End Date Taking? Authorizing Provider  insulin NPH-insulin regular (NOVOLIN 70/30) (70-30) 100 UNIT/ML injection Inject 60 Units into the skin 2 (two) times daily with a meal.    Yes Historical Provider, MD     Allergies:  Allergies  Allergen Reactions  . Codeine Nausea And Vomiting    dizziness    Social History:   reports that he has never smoked. He does not have any smokeless tobacco history on file. He reports that he does not drink alcohol or use illicit drugs.  Family History: History reviewed. No pertinent family history.   Physical Exam: Filed Vitals:   03/26/12 2257 03/26/12 2258 03/26/12 2300 03/27/12 0115  BP: 146/71 152/69 128/57 129/76  Pulse: 112 119 116 118  Temp:      TempSrc:      Resp:    19  SpO2:    98%   Blood pressure 129/76, pulse 118, temperature 98.8 F (37.1 C), temperature source Oral, resp. rate 19, SpO2 98.00%.  GEN:  Pleasant patient lying in the stretcher in no acute distress; cooperative with exam. PSYCH:  alert and oriented x4; does not appear anxious or depressed; affect is appropriate. HEENT: Mucous membranes pink and anicteric; PERRLA; EOM intact; no cervical lymphadenopathy nor thyromegaly or carotid bruit; no JVD; There were no stridor. Neck is very supple. Breasts:: Not examined CHEST WALL: No tenderness CHEST: Normal respiration, clear to auscultation bilaterally.  HEART: Regular rate  and rhythm.  There are no murmur, rub, or gallops.   BACK: No kyphosis or scoliosis; no CVA tenderness ABDOMEN: soft and non-tender; no masses, no organomegaly, normal abdominal bowel sounds; no pannus; no intertriginous candida.  There is no rebound and no distention. Rectal Exam: Not done.  :Guaic positive per EDP. EXTREMITIES: No bone or joint deformity; age-appropriate arthropathy of the hands and knees; no edema; no ulcerations.  There is no calf tenderness. Genitalia: not examined PULSES: 2+ and symmetric SKIN: Normal hydration no rash or ulceration CNS: Cranial nerves 2-12 grossly intact no focal lateralizing neurologic deficit.  Speech is fluent; uvula elevated with phonation, facial symmetry and tongue midline. DTR are normal bilaterally, cerebella exam is intact, barbinski is negative and strengths are equaled bilaterally.  No sensory loss.   Labs on Admission:  Basic Metabolic Panel:  Lab 03/26/12 1610  NA 134*  K 4.8  CL 97  CO2 21  GLUCOSE 292*  BUN 66*  CREATININE 1.10  CALCIUM 9.4  MG --  PHOS --   Liver Function Tests:  Lab 03/26/12 2209  AST 22  ALT 15  ALKPHOS 50  BILITOT 0.9  PROT 6.6  ALBUMIN 3.4*    Lab 03/27/12 0113  LIPASE 13  AMYLASE --   No results found for this basename: AMMONIA:5 in the last 168 hours CBC:  Lab 03/27/12 0113 03/26/12 2209  WBC 11.8* 13.8*  NEUTROABS -- 11.0*  HGB 9.1* 10.9*  HCT 25.0* 32.0*  MCV 91.9 93.3  PLT 78* 108*   Cardiac Enzymes: No results found for this basename: CKTOTAL:5,CKMB:5,CKMBINDEX:5,TROPONINI:5 in the last 168 hours  CBG: No results found for this basename: GLUCAP:5 in the last 168 hours   Radiological Exams on Admission: No results found.  Assessment/Plan Present on Admission:  . Upper GI bleed . DIABETES, TYPE 2 . CORONARY HEART DISEASE . ASTHMA  PLAN:  Will admit to SDU for upper GI bleed.  Will obtain INR and H/H q 4 hours.  Type and screen.  Start IV protonix drip.  For his DM, will cut his insulin requirement to half, give IV D5NS and place on clear liquid.  His CAD is stable.  I have asked the EDP to consult GI to notify of the admission.  He is stable, full code, and will be admitted to [facility] service.  Thank  you for allowing me to partake in the care of your nice patient.  Other plans as per orders.  Code Status: FULL Unk Lightning, MD. [facility] Pager [phone] 7pm to 7am.  03/27/2012, 2:29 AM

## 2012-03-27 NOTE — Progress Notes (Signed)
Patient ID: Alex Wise, male   DOB: 1931/04/12, 78 y.o.   MRN: [id number]  [facility] PROGRESS NOTE  Alex Wise WUJ:[id number] DOB: Sep 15, 1931 DOA: 03/26/2012 PCP: No primary provider on file.  Brief narrative: Pt is 78 y.o. male with hx of CAD, several MI's, s/p CABG, not on ASA, DM on insulin, who presented to the ER with main concern of one episode of vomiting blood and having black stool. He denied abdominal pain, fever, chills, chest pain or lightheadedness. He takes no advil, aleve, ASA, or any other meds except insulin. Evaluation in the ER showed Hb = 10, elevated BUN to 66, with normal Cr. He maintained hemodynamic stability and hospitalist was asked to admit him for upper GI bleed.  Principal Problem:  *Upper GI bleed - unclear etiology at this time - GI consulted for further recommendations - appreciate input - will trend Hg/Hct as is still continues to decrease - hold all heparin products  Active Problems:  DIABETES, TYPE 2 - will check A1C - continue home medication regimen with insulin and readjust the dose as indicated   CORONARY HEART DISEASE - in the setting of several MI's and s/p CABG - not on ASA or plavix - per pt, he is not tolerant of any blood thinners   Thrombocytopenia - I do not have any record from prior to this admission to establish the baseline - etiology is unclear for this, pt denies alcohol use - will continue to monitor  ASTHMA - clinically stable - maintaining oxygen saturations at target range - no wheezing on exam this AM  Consultants:  GI  Procedures/Studies:  None  Antibiotics:  None  Code Status: Full Family Communication: Pt at bedside Disposition Plan: Home when medically stable  HPI/Subjective: No events overnight. Pt reports feeling better this AM.  Objective: Filed Vitals:   03/27/12 0516 03/27/12 0600 03/27/12 0700 03/27/12 0800  BP: 98/48 101/67 108/47 116/55  Pulse: 113 107 110 108  Temp:    99  F (37.2 C)  TempSrc:    Oral  Resp: 18 14 20 15   Height:   5\' 7"  (1.702 m)   Weight:   88.2 kg (194 lb 7.1 oz)   SpO2: 99% 97% 96% 99%    Intake/Output Summary (Last 24 hours) at 03/27/12 0904 Last data filed at 03/27/12 0800  Gross per 24 hour  Intake  872.5 ml  Output    225 ml  Net  647.5 ml    Exam:   General:  Pt is alert, follows commands appropriately, not in acute distress  Cardiovascular: Regular rhythm, tachycardic, S1/S2, no murmurs, no rubs, no gallops  Respiratory: Clear to auscultation bilaterally, no wheezing, no crackles, no rhonchi  Abdomen: Soft, non tender, non distended, bowel sounds present, no guarding  Extremities: No edema, pulses DP and PT palpable bilaterally  Neuro: Grossly nonfocal  Data Reviewed: Basic Metabolic Panel:  Lab 03/26/12 9562  NA 134*  K 4.8  CL 97  CO2 21  GLUCOSE 292*  BUN 66*  CREATININE 1.10  CALCIUM 9.4  MG --  PHOS --   Liver Function Tests:  Lab 03/26/12 2209  AST 22  ALT 15  ALKPHOS 50  BILITOT 0.9  PROT 6.6  ALBUMIN 3.4*    Lab 03/27/12 0113  LIPASE 13  AMYLASE --   CBC:  Lab 03/27/12 0525 03/27/12 0113 03/26/12 2209  WBC 11.4* 11.8* 13.8*  NEUTROABS -- -- 11.0*  HGB 8.6* 9.1* 10.9*  HCT  24.2* 25.0* 32.0*  MCV 92.7 91.9 93.3  PLT 71* 78* 108*   CBG:  Lab 03/27/12 0753 03/27/12 0346 03/27/12 0240  GLUCAP 107* 271* 257*    Recent Results (from the past 240 hour(s))  MRSA PCR SCREENING     Status: Normal   Collection Time   03/27/12  2:53 AM      Component Value Range Status Comment   MRSA by PCR NEGATIVE  NEGATIVE Final      Scheduled Meds:   . hydrocortisone cream   Topical BID  . insulin aspart  0-15 Units Subcutaneous Q4H  . insulin glargine  10 Units Subcutaneous BID  . sodium chloride  3 mL Intravenous Q12H   Continuous Infusions:   . sodium chloride 50 mL/hr (03/27/12 0425)  . pantoprozole (PROTONIX) infusion 8 mg/hr (03/27/12 0254)     Debbora Presto,  MD  [facility] Pager [phone]  If 7PM-7AM, please contact night-coverage [email or web address] Password [email or web address] 03/27/2012, 9:04 AM   LOS: 1 day

## 2012-03-28 ENCOUNTER — Encounter (HOSPITAL_COMMUNITY): Payer: Self-pay | Admitting: Gastroenterology

## 2012-03-28 ENCOUNTER — Telehealth: Payer: Self-pay

## 2012-03-28 ENCOUNTER — Inpatient Hospital Stay (HOSPITAL_COMMUNITY): Payer: Medicare Other

## 2012-03-28 ENCOUNTER — Other Ambulatory Visit: Payer: Self-pay | Admitting: Gastroenterology

## 2012-03-28 DIAGNOSIS — I8500 Esophageal varices without bleeding: Secondary | ICD-10-CM

## 2012-03-28 DIAGNOSIS — K7460 Unspecified cirrhosis of liver: Secondary | ICD-10-CM

## 2012-03-28 DIAGNOSIS — J449 Chronic obstructive pulmonary disease, unspecified: Secondary | ICD-10-CM

## 2012-03-28 DIAGNOSIS — D696 Thrombocytopenia, unspecified: Secondary | ICD-10-CM

## 2012-03-28 DIAGNOSIS — I8501 Esophageal varices with bleeding: Secondary | ICD-10-CM

## 2012-03-28 HISTORY — DX: Unspecified cirrhosis of liver: K74.60

## 2012-03-28 LAB — BASIC METABOLIC PANEL
BUN: 40 mg/dL — ABNORMAL HIGH (ref 6–23)
Calcium: 8.4 mg/dL (ref 8.4–10.5)
Creatinine, Ser: 1.27 mg/dL (ref 0.50–1.35)
GFR calc Af Amer: 61 mL/min — ABNORMAL LOW (ref >90)
GFR calc non Af Amer: 52 mL/min — ABNORMAL LOW (ref >90)

## 2012-03-28 LAB — PREPARE RBC (CROSSMATCH)

## 2012-03-28 LAB — GLUCOSE, CAPILLARY
Glucose-Capillary: 153 mg/dL — ABNORMAL HIGH (ref 70–99)
Glucose-Capillary: 156 mg/dL — ABNORMAL HIGH (ref 70–99)
Glucose-Capillary: 185 mg/dL — ABNORMAL HIGH (ref 70–99)

## 2012-03-28 LAB — HEPATITIS PANEL, ACUTE: Hepatitis B Surface Ag: NEGATIVE

## 2012-03-28 LAB — CBC
HCT: 22 % — ABNORMAL LOW (ref 39.0–52.0)
MCH: 33.2 pg (ref 26.0–34.0)
MCHC: 35.5 g/dL (ref 30.0–36.0)
MCV: 93.6 fL (ref 78.0–100.0)
RDW: 14.1 % (ref 11.5–15.5)

## 2012-03-28 LAB — HEMOGLOBIN AND HEMATOCRIT, BLOOD: HCT: 24.3 % — ABNORMAL LOW (ref 39.0–52.0)

## 2012-03-28 MED ORDER — NADOLOL 40 MG PO TABS
40.0000 mg | ORAL_TABLET | Freq: Every day | ORAL | Status: DC
  Administered 2012-03-28 – 2012-03-30 (×3): 40 mg via ORAL
  Filled 2012-03-28 (×3): qty 1

## 2012-03-28 MED ORDER — IOHEXOL 300 MG/ML  SOLN
80.0000 mL | Freq: Once | INTRAMUSCULAR | Status: AC | PRN
  Administered 2012-03-28: 80 mL via INTRAVENOUS

## 2012-03-28 NOTE — Telephone Encounter (Signed)
Pt scheduled for EGD with banding at [facility] 04/12/12. Pt to arrive at 11:30am for a 12:30pm appt. Left message for pt to call back regarding appt date and time.

## 2012-03-28 NOTE — Progress Notes (Signed)
Patient ID: Alex Wise, male   DOB: April 20, 1931, 78 y.o.   MRN: [id number]  [facility] PROGRESS NOTE  Alex Wise:[id number] DOB: 05/04/1931 DOA: 03/26/2012 PCP: No primary provider on file.  Brief narrative: Pt is 78 y.o. male with hx of CAD, several MI's, s/p CABG, not on ASA, DM on insulin, who presented to the ER with main concern of one episode of vomiting blood and having black stool. He denied abdominal pain, fever, chills, chest pain or lightheadedness. He takes no advil, aleve, ASA, or any other meds except insulin. Evaluation in the ER showed Hb = 10, elevated BUN to 66, with normal Cr. He maintained hemodynamic stability and hospitalist was asked to admit him for upper GI bleed.  Principal Problem:   *Upper GI bleed - from esophageal variceal bleed.  - GI consulted, underwent an EGD on 12/23  showed moderate gastropathy and esophageal varices. Work up for hep B and C ordered, ct abd and pelvis pending.  - will trend Hg/Hct as is still continues to decrease. Will transfuse 1 unit of prbc transfusion.  - hold all heparin products.   Active Problems:  DIABETES, TYPE 2 -  A1C is 7.9 - continue home medication regimen with insulin and readjust the dose as indicated  CBG (last 3)   Basename 03/28/12 0734 03/28/12 0411 03/28/12 0018  GLUCAP 156* 132* 152*      CORONARY HEART DISEASE - in the setting of several MI's and s/p CABG - not on ASA or plavix - per pt, he is not tolerant of any blood thinners  - no chest pain or shortness of breath at this time.    Thrombocytopenia - I do not have any record from prior to this admission to establish the baseline - etiology is unclear , probably liver disease from the esophageal varices and gastropathy from EGD.  - will continue to monitor   ASTHMA - clinically stable - maintaining oxygen saturations at target range - no wheezing on exam this  AM  Consultants:  GI  Procedures/Studies:  None  Antibiotics:  None  Code Status: Full Family Communication: Pt at bedside Disposition Plan: Home when medically stable  HPI/Subjective: No new complaints.   Objective: Filed Vitals:   03/27/12 2000 03/27/12 2117 03/27/12 2156 03/28/12 0501  BP: 106/49  96/67 95/44  Pulse: 100 100 100 91  Temp: 98.9 F (37.2 C)  98.6 F (37 C) 99 F (37.2 C)  TempSrc: Oral  Oral Oral  Resp: 19 23 18 18   Height:      Weight:      SpO2: 100% 98% 99% 99%    Intake/Output Summary (Last 24 hours) at 03/28/12 0806 Last data filed at 03/28/12 0700  Gross per 24 hour  Intake   1585 ml  Output    550 ml  Net   1035 ml    Exam:   General:  Pt is alert, follows commands appropriately, not in acute distress  Cardiovascular: Regular rhythm, tachycardic, S1/S2, no murmurs, no rubs, no gallops  Respiratory: Clear to auscultation bilaterally, no wheezing, no crackles, no rhonchi  Abdomen: Soft, non tender, non distended, bowel sounds present, no guarding  Extremities: No edema, pulses DP and PT palpable bilaterally  Neuro: Grossly nonfocal  Data Reviewed: Basic Metabolic Panel:  Lab 03/28/12 9562 03/26/12 2209  NA 136 134*  K 3.7 4.8  CL 106 97  CO2 24 21  GLUCOSE 146* 292*  BUN 40* 66*  CREATININE 1.27  1.10  CALCIUM 8.4 9.4  MG -- --  PHOS -- --   Liver Function Tests:  Lab 03/26/12 2209  AST 22  ALT 15  ALKPHOS 50  BILITOT 0.9  PROT 6.6  ALBUMIN 3.4*    Lab 03/27/12 0113  LIPASE 13  AMYLASE --   CBC:  Lab 03/28/12 0450 03/27/12 1320 03/27/12 0919 03/27/12 0525 03/27/12 0113 03/26/12 2209  WBC 9.1 10.8* 11.8* 11.4* 11.8* --  NEUTROABS -- -- -- -- -- 11.0*  HGB 7.8* 8.8* 8.3* 8.6* 9.1* --  HCT 22.0* 24.3* 22.9* 24.2* 25.0* --  MCV 93.6 92.0 91.6 92.7 91.9 --  PLT 70* 66* 78* 71* 78* --   CBG:  Lab 03/28/12 0734 03/28/12 0411 03/28/12 0018 03/27/12 1951 03/27/12 1524  GLUCAP 156* 132* 152* 185* 184*     Recent Results (from the past 240 hour(s))  MRSA PCR SCREENING     Status: Normal   Collection Time   03/27/12  2:53 AM      Component Value Range Status Comment   MRSA by PCR NEGATIVE  NEGATIVE Final      Scheduled Meds:    . hydrocortisone cream   Topical BID  . insulin aspart  0-15 Units Subcutaneous Q4H  . insulin glargine  10 Units Subcutaneous BID  . metoprolol tartrate  25 mg Oral BID  . pantoprazole (PROTONIX) IV  40 mg Intravenous Q24H  . sodium chloride  3 mL Intravenous Q12H   Continuous Infusions:    . sodium chloride 50 mL/hr at 03/27/12 2228     Leshonda Galambos, MD  [facility] Pager [phone] If 7PM-7AM, please contact night-coverage [email or web address] Password [email or web address] 03/28/2012, 8:06 AM   LOS: 2 days

## 2012-03-28 NOTE — Telephone Encounter (Signed)
Waiting on pt to call back regarding appt.

## 2012-03-28 NOTE — Progress Notes (Signed)
[facility] Progress Note  Subjective:  Feels ok, but is weak according to nursing staff.  No sign of bleeding since admission.  Getting two units of blood today.  Objective:  Vital signs in last 24 hours: Temp:  [98.6 F (37 C)-99.2 F (37.3 C)] 99 F (37.2 C) (12/24 0501) Pulse Rate:  [91-134] 91  (12/24 0501) Resp:  [13-24] 18  (12/24 0501) BP: (89-155)/(44-96) 95/44 mmHg (12/24 0501) SpO2:  [98 %-100 %] 99 % (12/24 0501) Last BM Date:  (PTA) General:   Alert, Well-developed, in NAD Heart:  Regular rate and rhythm; no murmurs Pulm:  CTAB.  No W/R/R. Abdomen:  Soft, nontender and nondistended. Normal bowel sounds, without guarding, and without rebound.   Extremities:  Without edema. Neurologic:  Alert and  oriented x4;  grossly normal neurologically. Psych:  Alert and cooperative. Normal mood and affect.  Intake/Output from previous day: 12/23 0701 - 12/24 0700 In: 1760 [P.O.:440; I.V.:1310; IV Piggyback:10] Out: 550 [Urine:550]  Lab Results:  [facility] 03/28/12 0450 03/27/12 1320 03/27/12 0919  WBC 9.1 10.8* 11.8*  HGB 7.8* 8.8* 8.3*  HCT 22.0* 24.3* 22.9*  PLT 70* 66* 78*   BMET  Basename 03/28/12 0450 03/26/12 2209  NA 136 134*  K 3.7 4.8  CL 106 97  CO2 24 21  GLUCOSE 146* 292*  BUN 40* 66*  CREATININE 1.27 1.10  CALCIUM 8.4 9.4   LFT  Basename 03/26/12 2209  PROT 6.6  ALBUMIN 3.4*  AST 22  ALT 15  ALKPHOS 50  BILITOT 0.9  BILIDIR --  IBILI --   PT/INR  Basename 03/27/12 0113  LABPROT 15.3*  INR 1.23   Assessment / Plan: -Cirrhosis of unknown etiology with grade 4 esophageal varices s/p banding on EGD 12/23.  Portal gastropathy as well. -UGIB: Secondary to the above. -ABLA:  Hgb down some this morning.  Going to receive transfusion of 2 units PRBC's today. -Uncontrolled insulin dependent DM.   *Await ultrasound result, AFP, and remaining liver serologies.  Ferritin is high at 482, iron is high at 228, TIBC and % sat could not  be calculated.  ? Check HFe gene. *Repeat EGD in 2 weeks. *Continue nadolol 40 mg daily if BP with tolerate; he has not started this medication yet and has low BP readings. *Continue IV PPI for now.   LOS: 2 days   ZEHR, JESSICA D.  03/28/2012, 9:07 AM  Pager number [phone]  I have personally taken an interval history, reviewed the chart, and examined the patient.  I agree with the extender's note, impression and recommendations.  Doubt hemochromatosis on basis of ferritin level.  I have changed ultrasound to CT since we can extract more information from this test regarding liver disease.    Barbette Hair. Arlyce Dice, MD, [facility] [phone]

## 2012-03-28 NOTE — Progress Notes (Signed)
   CARE MANAGEMENT NOTE 03/28/2012  Patient:  Alex Wise, Alex Wise   Account Number:  [id number]  Date Initiated:  03/28/2012  Documentation initiated by:  Jiles Crocker  Subjective/Objective Assessment:   ADMITTED WITH GIB     Action/Plan:   PCP:   Dr Ramond Craver.  LIVES AT HOME; POSSIBLY NEED HHC AT DISCHARGE; CM WILL CONTINUE TO FOLLOW FOR DCP   Anticipated DC Date:  03/31/2012   Anticipated DC Plan:  HOME/SELF CARE      DC Planning Services  CM consult             Status of service:  In process, will continue to follow Medicare Important Message given?  NA - LOS <3 / Initial given by admissions (If response is "NO", the following Medicare IM given date fields will be blank)  Per UR Regulation:  Reviewed for med. necessity/level of care/duration of stay  Comments:  03/28/2012- B Jamerius Boeckman RN,BSN,MHA

## 2012-03-29 DIAGNOSIS — K7460 Unspecified cirrhosis of liver: Secondary | ICD-10-CM

## 2012-03-29 LAB — TYPE AND SCREEN: ABO/RH(D): B POS

## 2012-03-29 LAB — GLUCOSE, CAPILLARY
Glucose-Capillary: 151 mg/dL — ABNORMAL HIGH (ref 70–99)
Glucose-Capillary: 174 mg/dL — ABNORMAL HIGH (ref 70–99)

## 2012-03-29 LAB — CBC
Hemoglobin: 8.5 g/dL — ABNORMAL LOW (ref 13.0–17.0)
MCH: 33.5 pg (ref 26.0–34.0)
MCHC: 35.6 g/dL (ref 30.0–36.0)
MCV: 94.1 fL (ref 78.0–100.0)
Platelets: 65 10*3/uL — ABNORMAL LOW (ref 150–400)
RBC: 2.54 MIL/uL — ABNORMAL LOW (ref 4.22–5.81)

## 2012-03-29 LAB — HEPATITIS PANEL, ACUTE
HCV Ab: NEGATIVE
Hep A IgM: NEGATIVE
Hep B C IgM: NEGATIVE

## 2012-03-29 LAB — AFP TUMOR MARKER: AFP-Tumor Marker: 3 ng/mL (ref 0.0–8.0)

## 2012-03-29 LAB — ANA: Anti Nuclear Antibody(ANA): NEGATIVE

## 2012-03-29 NOTE — Progress Notes (Signed)
[facility] Progress Note  Subjective: *No further overt bleeding**  Objective:  Vital signs in last 24 hours: Temp:  [98.8 F (37.1 C)-99.2 F (37.3 C)] 98.8 F (37.1 C) (12/25 0517) Pulse Rate:  [76-90] 80  (12/25 0517) Resp:  [12-18] 18  (12/25 0517) BP: (88-112)/(48-97) 106/48 mmHg (12/25 0517) SpO2:  [98 %-100 %] 98 % (12/25 0517) Last BM Date: 03/28/12 General:   Alert,  Well-developed,  white male in NAD Heart:  Regular rate and rhythm; no murmurs Abdomen:  Soft, nontender and nondistended. Normal bowel sounds, without guarding, and without rebound.   Extremities:  Without edema. Neurologic:  Alert and  oriented x4;  grossly normal neurologically. Psych:  Alert and cooperative. Normal mood and affect.  Intake/Output from previous day: 12/24 0701 - 12/25 0700 In: 2360 [P.O.:1060; I.V.:1300] Out: 1000 [Urine:1000] Intake/Output this shift: Total I/O In: 100 [P.O.:100] Out: -   Lab Results:  Basename 03/29/12 0928 03/28/12 1822 03/28/12 0450 03/27/12 1320  WBC 9.6 -- 9.1 10.8*  HGB 8.5* 8.7* 7.8* --  HCT 23.9* 24.3* 22.0* --  PLT 65* -- 70* 66*   BMET  Basename 03/28/12 0450 03/26/12 2209  NA 136 134*  K 3.7 4.8  CL 106 97  CO2 24 21  GLUCOSE 146* 292*  BUN 40* 66*  CREATININE 1.27 1.10  CALCIUM 8.4 9.4   LFT  Basename 03/26/12 2209  PROT 6.6  ALBUMIN 3.4*  AST 22  ALT 15  ALKPHOS 50  BILITOT 0.9  BILIDIR --  IBILI --   PT/INR  Basename 03/27/12 0113  LABPROT 15.3*  INR 1.23   Hepatitis Panel  Basename 03/28/12 1816  HEPBSAG NEGATIVE  HCVAB NEGATIVE  HEPAIGM PENDING  HEPBIGM PENDING    Studies/Results: Ct Abdomen W Contrast  03/28/2012  *RADIOLOGY REPORT*  Clinical Data: Possible cirrhosis  CT ABDOMEN WITH CONTRAST  Technique:  Multidetector CT imaging of the abdomen was performed following the standard protocol during bolus administration of intravenous contrast.  Contrast: 80mL OMNIPAQUE IOHEXOL 300 MG/ML  SOLN   Comparison: None.  Findings: Chronic changes are present at the lung bases with interstitial markings and interlobular thickening consistent with pulmonary fibrosis.  The liver is low in attenuation and there is slight nodularity to the contours particularly of the prominent left lobe which may indicate changes of cirrhosis.  No intrahepatic ductal dilatation is seen.  Gallstones layer within the gallbladder, with the largest measuring 12 mm in diameter.  The pancreas is normal in size and the pancreatic duct is not dilated. The adrenal glands and spleen are unremarkable.  The kidneys enhance and there is a single simple appearing cyst in the lower pole of the left kidney anteriorly.  On delayed images, the pelvocaliceal systems are unremarkable. There is mild thickening of the anterior right pararenal space which may indicate scarring or a small amount of fluid.  There is significant atheromatous change throughout the entire abdominal aorta but no focal aneurysm is noted.  No adenopathy is seen.  The terminal ileum is unremarkable and the portion of the appendix that is visualized is normal. There is degenerative disc disease throughout the lower lumbar spine.  IMPRESSION:  1.  The liver is low in attenuation with slightly nodular contours and prominence of the left lobe, findings consistent with mild cirrhosis.  No ductal dilatation is seen. 2.  Gallstones. 3.  Significant atheromatous change throughout the entire abdominal aorta. 4.  Probable pulmonary fibrosis at the lung bases.   Original Report  Authenticated By: Dwyane Dee, M.D.      Assessment / Plan: *1.  Acute upper GI bleed secondary to esophageal varices-resolved following band ligation 2.  cirrhosis with portal hypertension. CT demonstrates nodularity of the left lobe of the liver. Cirrhosis is probably on the basis of [place]  Recommendations 1. advance diet**  2.  continue nadolol 3.  if stable okay for discharge in a.m.  Principal Problem:   *Upper GI bleed Active Problems:  DIABETES, TYPE 2  CORONARY HEART DISEASE  ASTHMA  Thrombocytopenia  Acute posthemorrhagic anemia  Esophageal varices with bleeding  Cirrhosis of liver     LOS: 3 days   Alex Wise  03/29/2012, 12:56 PM

## 2012-03-29 NOTE — ED Provider Notes (Signed)
Medical screening examination/treatment/procedure(s) were conducted as a shared visit with non-physician practitioner(s) and myself.  I personally evaluated the patient during the encounter  Alex Wise is a 78 y.o. male hx of HTN, DM here with melena and 2 episodes of hematemsis. No prior hx of GI bleeds. Not on anticoagulants at this point. He was tachycardic initially. His Hg was 10.9 and BUN elevated. He was given IVF and protonix in the ED. He was admitted to medicine for likely upper GI bleed.     Richardean Canal, MD 03/29/12 [phone]

## 2012-03-29 NOTE — Progress Notes (Signed)
Patient ID: Alex Wise, male   DOB: 09-Aug-1931, 78 y.o.   MRN: [id number]  [facility] PROGRESS NOTE  Alex Wise WUJ:[id number] DOB: Apr 30, 1931 DOA: 03/26/2012 PCP: No primary provider on file.  Brief narrative: Pt is 78 y.o. male with hx of CAD, several MI's, s/p CABG, not on ASA, DM on insulin, who presented to the ER with main concern of one episode of vomiting blood and having black stool. He denied abdominal pain, fever, chills, chest pain or lightheadedness. He takes no advil, aleve, ASA, or any other meds except insulin. Evaluation in the ER showed Hb = 10, elevated BUN to 66, with normal Cr. He maintained hemodynamic stability and hospitalist was asked to admit him for upper GI bleed.  Principal Problem:   *Upper GI bleed - from esophageal variceal bleed.  - GI consulted, underwent an EGD on 12/23  showed moderate gastropathy and esophageal varices. Work up for hep B and C negative, AFP is 3, and his CT abd and pelvis shows mild cirrhosis.  - his H&H was 7.4 and he received 1 unit of prbc transfusion and his H&H improved to 8.7.  - will order ceruloplasmin levels and await GI recommendations.   Active Problems:  DIABETES, TYPE 2 -  A1C is 7.9 - continue home medication regimen with insulin and readjust the dose as indicated  CBG (last 3)   Basename 03/29/12 0725 03/29/12 0408 03/28/12 2353  GLUCAP 142* 138* 185*      CORONARY HEART DISEASE - in the setting of several MI's and s/p CABG - not on ASA or plavix - per pt, he is not tolerant of any blood thinners  - no chest pain or shortness of breath at this time.    Thrombocytopenia - I do not have any record from prior to this admission to establish the baseline - etiology is unclear , probably liver disease   - will continue to monitor   ASTHMA - clinically stable - maintaining oxygen saturations at target range - no wheezing on exam this  AM  Consultants:  GI  Procedures/Studies:  None  Antibiotics:  None  Code Status: Full Family Communication: Pt at bedside Disposition Plan: Home when medically stable  HPI/Subjective: No new complaints.   Objective: Filed Vitals:   03/28/12 1951 03/28/12 2100 03/29/12 0409 03/29/12 0517  BP: 88/54 112/50 98/56 106/48  Pulse: 80 76 81 80  Temp: 99.2 F (37.3 C)  99.1 F (37.3 C) 98.8 F (37.1 C)  TempSrc: Oral  Oral Oral  Resp: 18  18 18   Height:      Weight:      SpO2: 98%  100% 98%    Intake/Output Summary (Last 24 hours) at 03/29/12 0903 Last data filed at 03/29/12 0827  Gross per 24 hour  Intake   2460 ml  Output   1000 ml  Net   1460 ml    Exam:   General:  Pt is alert, follows commands appropriately, not in acute distress  Cardiovascular: Regular rhythm, tachycardic, S1/S2, no murmurs, no rubs, no gallops  Respiratory: Clear to auscultation bilaterally, no wheezing, no crackles, no rhonchi  Abdomen: Soft, non tender, non distended, bowel sounds present, no guarding  Extremities: No edema, pulses DP and PT palpable bilaterally  Neuro: Grossly nonfocal  Data Reviewed: Basic Metabolic Panel:  Lab 03/28/12 9562 03/26/12 2209  NA 136 134*  K 3.7 4.8  CL 106 97  CO2 24 21  GLUCOSE 146* 292*  BUN  40* 66*  CREATININE 1.27 1.10  CALCIUM 8.4 9.4  MG -- --  PHOS -- --   Liver Function Tests:  Lab 03/26/12 2209  AST 22  ALT 15  ALKPHOS 50  BILITOT 0.9  PROT 6.6  ALBUMIN 3.4*    Lab 03/27/12 0113  LIPASE 13  AMYLASE --   CBC:  Lab 03/28/12 1822 03/28/12 0450 03/27/12 1320 03/27/12 0919 03/27/12 0525 03/27/12 0113 03/26/12 2209  WBC -- 9.1 10.8* 11.8* 11.4* 11.8* --  NEUTROABS -- -- -- -- -- -- 11.0*  HGB 8.7* 7.8* 8.8* 8.3* 8.6* -- --  HCT 24.3* 22.0* 24.3* 22.9* 24.2* -- --  MCV -- 93.6 92.0 91.6 92.7 91.9 --  PLT -- 70* 66* 78* 71* 78* --   CBG:  Lab 03/29/12 0725 03/29/12 0408 03/28/12 2353 03/28/12 1954 03/28/12 1617   GLUCAP 142* 138* 185* 147* 153*    Recent Results (from the past 240 hour(s))  MRSA PCR SCREENING     Status: Normal   Collection Time   03/27/12  2:53 AM      Component Value Range Status Comment   MRSA by PCR NEGATIVE  NEGATIVE Final      Scheduled Meds:    . hydrocortisone cream   Topical BID  . insulin aspart  0-15 Units Subcutaneous Q4H  . insulin glargine  10 Units Subcutaneous BID  . nadolol  40 mg Oral Daily  . pantoprazole (PROTONIX) IV  40 mg Intravenous Q24H  . sodium chloride  3 mL Intravenous Q12H   Continuous Infusions:    . sodium chloride 50 mL/hr at 03/27/12 2228     Jozlin Bently, MD  [facility] Pager [phone] If 7PM-7AM, please contact night-coverage [email or web address] Password [email or web address] 03/29/2012, 9:03 AM   LOS: 3 days

## 2012-03-30 DIAGNOSIS — K922 Gastrointestinal hemorrhage, unspecified: Secondary | ICD-10-CM

## 2012-03-30 LAB — GLUCOSE, CAPILLARY: Glucose-Capillary: 131 mg/dL — ABNORMAL HIGH (ref 70–99)

## 2012-03-30 MED ORDER — PANTOPRAZOLE SODIUM 40 MG PO TBEC: 40.0000 mg | DELAYED_RELEASE_TABLET | Freq: Every day | ORAL | Status: DC

## 2012-03-30 MED ORDER — NADOLOL 40 MG PO TABS: 40.0000 mg | ORAL_TABLET | Freq: Every day | ORAL | Status: DC

## 2012-03-30 MED ORDER — HYDROCORTISONE 1 % EX CREA: TOPICAL_CREAM | Freq: Two times a day (BID) | CUTANEOUS | Status: DC

## 2012-03-30 NOTE — Discharge Summary (Signed)
Physician Discharge Summary  Alex Wise NWG:[id number] DOB: 22-Nov-1931 DOA: 03/26/2012  PCP: No primary provider on file.  Admit date: 03/26/2012 Discharge date: 03/30/2012    Recommendations for Outpatient Follow-up:  1. Follow up with PCP and obtain a cbc in one week at PCP office. 2. Follow upw ith [facility] / Dr Arlyce Dice as recommended.  3. Follow up with GI with the pending results.   Discharge Diagnoses:  Principal Problem:  *Upper GI bleed Active Problems:  DIABETES, TYPE 2  CORONARY HEART DISEASE  ASTHMA  Thrombocytopenia  Acute posthemorrhagic anemia  Esophageal varices with bleeding  Cirrhosis of liver   Discharge Condition: stable  Diet recommendation: CARB MODIFIED DIET  Filed Weights   03/27/12 0700  Weight: 88.2 kg (194 lb 7.1 oz)    History of present illness:   Pt is 78 y.o. male with hx of CAD, several MI's, s/p CABG, not on ASA, DM on insulin, who presented to the ER with main concern of one episode of vomiting blood and having black stool. He denied abdominal pain, fever, chills, chest pain or lightheadedness. He takes no advil, aleve, ASA, or any other meds except insulin. Evaluation in the ER showed Hb = 10, elevated BUN to 66, with normal Cr. He maintained hemodynamic stability and hospitalist was asked to admit him for upper GI bleed.  Hospital Course:   Upper GI bleed from esophageal variceal bleed.  - GI consulted, underwent an EGD on 12/23 showed moderate gastropathy and esophageal varices. Work up for hep B and C negative, AFP is 3, ceruloplasmin level is 22 and the anti trypsin level is pending and his CT abd and pelvis shows mild cirrhosis. His mild cirrhosis possibly from NASH.  his H&H was 7.4 and he received 1 unit of prbc transfusion and his H&H improved to 8.7.  He is stable to be discharged home and recommend checking cbc in one week and following with GI to follow up with the other results.    DIABETES, TYPE 2  - A1C is 7.9  -  continue home medication regimen with insulin and readjust the dose as indicated  CBG (last 3)   Basename  03/29/12 0725  03/29/12 0408  03/28/12 2353   GLUCAP  142*  138*  185*    CORONARY HEART DISEASE  - in the setting of several MI's and s/p CABG  - not on ASA or plavix  - per pt, he is not tolerant of any blood thinners  - no chest pain or shortness of breath at this time.  Thrombocytopenia  - I do not have any record from prior to this admission to establish the baseline  - etiology is unclear , probably from liver disease . No overt bleeding seen at the time of discharge. ASTHMA  - clinically stable  - maintaining oxygen saturations at target range  - no wheezing on exam this AM   Procedures: EGD SHOWING  1. grade 4 esophageal varices in lower one half of the  esophagus-status post band ligation  2. portal hypertensive gastropathy    Consultations:  GI consult  Discharge Exam: Filed Vitals:   03/29/12 0517 03/29/12 1304 03/29/12 2203 03/30/12 0512  BP: 106/48 104/50 118/56 117/58  Pulse: 80 77 79 76  Temp: 98.8 F (37.1 C) 98.2 F (36.8 C) 98.9 F (37.2 C) 98.6 F (37 C)  TempSrc: Oral Oral Oral Oral  Resp: 18 18 18 18   Height:      Weight:  SpO2: 98% 98% 97% 100%   General: Alert, Well-developed, in NAD  Heart: Regular rate and rhythm; no murmurs  Abdomen: Soft, nontender and nondistended. Normal bowel sounds, without guarding, and without rebound.  Extremities: Without edema.  Neurologic: Alert and oriented x4; grossly normal neurologically.  Psych: Alert and cooperative. Normal mood and affect.   Discharge Instructions  Discharge Orders    Future Orders Please Complete By Expires   Diet Carb Modified      Discharge instructions      Comments:   Follow up with PCP  And [facility] as needed. If you see any bleeding please call GI or come to EMERGENCY.   Activity as tolerated - No restrictions          Medication List     As of  03/30/2012  9:16 AM    TAKE these medications         hydrocortisone cream 1 %   Apply topically 2 (two) times daily.      insulin NPH-insulin regular (70-30) 100 UNIT/ML injection   Commonly known as: NOVOLIN 70/30   Inject 60 Units into the skin 2 (two) times daily with a meal.      nadolol 40 MG tablet   Commonly known as: CORGARD   Take 1 tablet (40 mg total) by mouth daily.      pantoprazole 40 MG tablet   Commonly known as: PROTONIX   Take 1 tablet (40 mg total) by mouth daily.          The results of significant diagnostics from this hospitalization (including imaging, microbiology, ancillary and laboratory) are listed below for reference.    Significant Diagnostic Studies: Ct Abdomen W Contrast  03/28/2012  *RADIOLOGY REPORT*  Clinical Data: Possible cirrhosis  CT ABDOMEN WITH CONTRAST  Technique:  Multidetector CT imaging of the abdomen was performed following the standard protocol during bolus administration of intravenous contrast.  Contrast: 80mL OMNIPAQUE IOHEXOL 300 MG/ML  SOLN  Comparison: None.  Findings: Chronic changes are present at the lung bases with interstitial markings and interlobular thickening consistent with pulmonary fibrosis.  The liver is low in attenuation and there is slight nodularity to the contours particularly of the prominent left lobe which may indicate changes of cirrhosis.  No intrahepatic ductal dilatation is seen.  Gallstones layer within the gallbladder, with the largest measuring 12 mm in diameter.  The pancreas is normal in size and the pancreatic duct is not dilated. The adrenal glands and spleen are unremarkable.  The kidneys enhance and there is a single simple appearing cyst in the lower pole of the left kidney anteriorly.  On delayed images, the pelvocaliceal systems are unremarkable. There is mild thickening of the anterior right pararenal space which may indicate scarring or a small amount of fluid.  There is significant atheromatous  change throughout the entire abdominal aorta but no focal aneurysm is noted.  No adenopathy is seen.  The terminal ileum is unremarkable and the portion of the appendix that is visualized is normal. There is degenerative disc disease throughout the lower lumbar spine.  IMPRESSION:  1.  The liver is low in attenuation with slightly nodular contours and prominence of the left lobe, findings consistent with mild cirrhosis.  No ductal dilatation is seen. 2.  Gallstones. 3.  Significant atheromatous change throughout the entire abdominal aorta. 4.  Probable pulmonary fibrosis at the lung bases.   Original Report Authenticated By: Dwyane Dee, M.D.     Microbiology: Recent  Results (from the past 240 hour(s))  MRSA PCR SCREENING     Status: Normal   Collection Time   03/27/12  2:53 AM      Component Value Range Status Comment   MRSA by PCR NEGATIVE  NEGATIVE Final      Labs: Basic Metabolic Panel:  Lab 03/28/12 1610 03/26/12 2209  NA 136 134*  K 3.7 4.8  CL 106 97  CO2 24 21  GLUCOSE 146* 292*  BUN 40* 66*  CREATININE 1.27 1.10  CALCIUM 8.4 9.4  MG -- --  PHOS -- --   Liver Function Tests:  Lab 03/26/12 2209  AST 22  ALT 15  ALKPHOS 50  BILITOT 0.9  PROT 6.6  ALBUMIN 3.4*    Lab 03/27/12 0113  LIPASE 13  AMYLASE --   No results found for this basename: AMMONIA:5 in the last 168 hours CBC:  Lab 03/29/12 0928 03/28/12 1822 03/28/12 0450 03/27/12 1320 03/27/12 0919 03/27/12 0525 03/26/12 2209  WBC 9.6 -- 9.1 10.8* 11.8* 11.4* --  NEUTROABS -- -- -- -- -- -- 11.0*  HGB 8.5* 8.7* 7.8* 8.8* 8.3* -- --  HCT 23.9* 24.3* 22.0* 24.3* 22.9* -- --  MCV 94.1 -- 93.6 92.0 91.6 92.7 --  PLT 65* -- 70* 66* 78* 71* --   Cardiac Enzymes: No results found for this basename: CKTOTAL:5,CKMB:5,CKMBINDEX:5,TROPONINI:5 in the last 168 hours BNP: BNP (last 3 results) No results found for this basename: PROBNP:3 in the last 8760 hours CBG:  Lab 03/30/12 0742 03/30/12 0404 03/29/12 2342  03/29/12 2002 03/29/12 1626  GLUCAP 147* 131* 174* 172* 151*       Signed:  Keosha Rossa  [facility] 03/30/2012, 9:16 AM

## 2012-04-04 LAB — ALPHA-1 ANTITRYPSIN PHENOTYPE: A-1 Antitrypsin: 135 mg/dL (ref 83–199)

## 2012-04-07 NOTE — Telephone Encounter (Signed)
Called and spoke with pt. States he will call back later to get details he was in a restaurant.  Pt called back and he is aware of appt date and time.

## 2012-04-19 ENCOUNTER — Ambulatory Visit (HOSPITAL_COMMUNITY)
Admission: RE | Admit: 2012-04-19 | Discharge: 2012-04-19 | Disposition: A | Discharge status: Home Health | Payer: Medicare Other | Source: Ambulatory Visit | Attending: Gastroenterology | Admitting: Gastroenterology

## 2012-04-19 ENCOUNTER — Encounter (HOSPITAL_COMMUNITY): Payer: Self-pay | Admitting: Gastroenterology

## 2012-04-19 ENCOUNTER — Encounter (HOSPITAL_COMMUNITY)
Admission: RE | Disposition: A | Discharge status: Home Health | Payer: Self-pay | Source: Ambulatory Visit | Attending: Gastroenterology

## 2012-04-19 DIAGNOSIS — K766 Portal hypertension: Secondary | ICD-10-CM | POA: Insufficient documentation

## 2012-04-19 DIAGNOSIS — E119 Type 2 diabetes mellitus without complications: Secondary | ICD-10-CM | POA: Insufficient documentation

## 2012-04-19 DIAGNOSIS — I2510 Atherosclerotic heart disease of native coronary artery without angina pectoris: Secondary | ICD-10-CM | POA: Insufficient documentation

## 2012-04-19 DIAGNOSIS — I8500 Esophageal varices without bleeding: Secondary | ICD-10-CM

## 2012-04-19 DIAGNOSIS — D696 Thrombocytopenia, unspecified: Secondary | ICD-10-CM | POA: Insufficient documentation

## 2012-04-19 DIAGNOSIS — I8511 Secondary esophageal varices with bleeding: Secondary | ICD-10-CM | POA: Insufficient documentation

## 2012-04-19 DIAGNOSIS — J45909 Unspecified asthma, uncomplicated: Secondary | ICD-10-CM | POA: Insufficient documentation

## 2012-04-19 DIAGNOSIS — K7460 Unspecified cirrhosis of liver: Secondary | ICD-10-CM | POA: Insufficient documentation

## 2012-04-19 HISTORY — DX: Unspecified cirrhosis of liver: K74.60

## 2012-04-19 HISTORY — DX: Esophageal varices with bleeding: I85.01

## 2012-04-19 HISTORY — DX: Acute myocardial infarction, unspecified: I21.9

## 2012-04-19 HISTORY — PX: ESOPHAGOGASTRODUODENOSCOPY: SHX5428

## 2012-04-19 HISTORY — PX: ESOPHAGEAL BANDING: SHX5518

## 2012-04-19 SURGERY — EGD (ESOPHAGOGASTRODUODENOSCOPY)
Anesthesia: Moderate Sedation

## 2012-04-19 MED ORDER — SODIUM CHLORIDE 0.9 % IV SOLN: INTRAVENOUS | Status: DC

## 2012-04-19 MED ORDER — MIDAZOLAM HCL 10 MG/2ML IJ SOLN
INTRAMUSCULAR | Status: AC
  Filled 2012-04-19: qty 2

## 2012-04-19 MED ORDER — MIDAZOLAM HCL 10 MG/2ML IJ SOLN
INTRAMUSCULAR | Status: DC | PRN
  Administered 2012-04-19: 1 mg via INTRAVENOUS
  Administered 2012-04-19: 2 mg via INTRAVENOUS
  Administered 2012-04-19: 1 mg via INTRAVENOUS
  Administered 2012-04-19: 2 mg via INTRAVENOUS

## 2012-04-19 MED ORDER — GLYCOPYRROLATE 0.2 MG/ML IJ SOLN
INTRAMUSCULAR | Status: AC
  Filled 2012-04-19: qty 1

## 2012-04-19 MED ORDER — BUTAMBEN-TETRACAINE-BENZOCAINE 2-2-14 % EX AERO
INHALATION_SPRAY | CUTANEOUS | Status: DC | PRN
  Administered 2012-04-19: 2 via TOPICAL

## 2012-04-19 MED ORDER — GLYCOPYRROLATE 0.2 MG/ML IJ SOLN
INTRAMUSCULAR | Status: DC | PRN
  Administered 2012-04-19: 0.2 mg via INTRAVENOUS

## 2012-04-19 MED ORDER — FENTANYL CITRATE 0.05 MG/ML IJ SOLN
INTRAMUSCULAR | Status: DC | PRN
  Administered 2012-04-19 (×2): 25 ug via INTRAVENOUS

## 2012-04-19 MED ORDER — FENTANYL CITRATE 0.05 MG/ML IJ SOLN
INTRAMUSCULAR | Status: AC
  Filled 2012-04-19: qty 2

## 2012-04-19 NOTE — Op Note (Signed)
[facility] [address] [place], [address]   ENDOSCOPY PROCEDURE REPORT  PATIENT: Alex Wise, Alex Wise  MR#: [id number] BIRTHDATE: [birth year] , 80  yrs. old GENDER: Male ENDOSCOPIST: Louis Meckel, MD REFERRED BY: PROCEDURE DATE:  04/19/2012 PROCEDURE:  EGD w/ band ligation of varices  - Banding of varices ASA CLASS:     Class III INDICATIONS:  Therapeutic procedure. MEDICATIONS: These medications were titrated to patient response per physician's verbal order, Versed 6 mg IV, Fentanyl 50 mcg IV, and Robinul 0.2 mg IV TOPICAL ANESTHETIC: Cetacaine Spray  DESCRIPTION OF PROCEDURE: After the risks benefits and alternatives of the procedure were thoroughly explained, informed consent was obtained.  The Pentax Gastroscope [id number] endoscope was introduced through the mouth and advanced to the   . Without limitations.  The instrument was slowly withdrawn as the mucosa was fully examined.      There was scarring in the distal esophagus corresponding to previously and it varices.  At least 3 variceal columns were seen that were 4+ in size.   There was scarring in the distal esophagus corresponding to previously and it varices.  At least 3 variceal columns were seen that were 4+ in size.   The remainder of the upper endoscopy exam was otherwise normal.  Retroflexed views revealed no abnormalities.     The scope was then withdrawn from the patient.  After placing the bander on the tip of the endoscope, it was reinserted into the esophagus. 6 bands were placed in the lower esophagus incorporating the remaining varices. Scope was then withdrawn.  COMPLICATIONS: There were no complications.  ENDOSCOPIC IMPRESSION:  RECOMEsophageal varices-status post band ligation MENDATIONS:  1.  Continue nadolol 2.  followup endoscopy in approximately one month 1.  EAT EXAM:  eSigned:  Louis Meckel, MD 04/19/2012 1:23 PM   CC:  PATIENT NAME:  Alex Wise, Alex Wise MR#:  [id number]

## 2012-04-19 NOTE — H&P (View-Only) (Signed)
[facility] Progress Note  Subjective: *No further overt bleeding**  Objective:  Vital signs in last 24 hours: Temp:  [98.8 F (37.1 C)-99.2 F (37.3 C)] 98.8 F (37.1 C) (12/25 0517) Pulse Rate:  [76-90] 80  (12/25 0517) Resp:  [12-18] 18  (12/25 0517) BP: (88-112)/(48-97) 106/48 mmHg (12/25 0517) SpO2:  [98 %-100 %] 98 % (12/25 0517) Last BM Date: 03/28/12 General:   Alert,  Well-developed,  white male in NAD Heart:  Regular rate and rhythm; no murmurs Abdomen:  Soft, nontender and nondistended. Normal bowel sounds, without guarding, and without rebound.   Extremities:  Without edema. Neurologic:  Alert and  oriented x4;  grossly normal neurologically. Psych:  Alert and cooperative. Normal mood and affect.  Intake/Output from previous day: 12/24 0701 - 12/25 0700 In: 2360 [P.O.:1060; I.V.:1300] Out: 1000 [Urine:1000] Intake/Output this shift: Total I/O In: 100 [P.O.:100] Out: -   Lab Results:  Basename 03/29/12 0928 03/28/12 1822 03/28/12 0450 03/27/12 1320  WBC 9.6 -- 9.1 10.8*  HGB 8.5* 8.7* 7.8* --  HCT 23.9* 24.3* 22.0* --  PLT 65* -- 70* 66*   BMET  Basename 03/28/12 0450 03/26/12 2209  NA 136 134*  K 3.7 4.8  CL 106 97  CO2 24 21  GLUCOSE 146* 292*  BUN 40* 66*  CREATININE 1.27 1.10  CALCIUM 8.4 9.4   LFT  Basename 03/26/12 2209  PROT 6.6  ALBUMIN 3.4*  AST 22  ALT 15  ALKPHOS 50  BILITOT 0.9  BILIDIR --  IBILI --   PT/INR  Basename 03/27/12 0113  LABPROT 15.3*  INR 1.23   Hepatitis Panel  Basename 03/28/12 1816  HEPBSAG NEGATIVE  HCVAB NEGATIVE  HEPAIGM PENDING  HEPBIGM PENDING    Studies/Results: Ct Abdomen W Contrast  03/28/2012  *RADIOLOGY REPORT*  Clinical Data: Possible cirrhosis  CT ABDOMEN WITH CONTRAST  Technique:  Multidetector CT imaging of the abdomen was performed following the standard protocol during bolus administration of intravenous contrast.  Contrast: 80mL OMNIPAQUE IOHEXOL 300 MG/ML  SOLN   Comparison: None.  Findings: Chronic changes are present at the lung bases with interstitial markings and interlobular thickening consistent with pulmonary fibrosis.  The liver is low in attenuation and there is slight nodularity to the contours particularly of the prominent left lobe which may indicate changes of cirrhosis.  No intrahepatic ductal dilatation is seen.  Gallstones layer within the gallbladder, with the largest measuring 12 mm in diameter.  The pancreas is normal in size and the pancreatic duct is not dilated. The adrenal glands and spleen are unremarkable.  The kidneys enhance and there is a single simple appearing cyst in the lower pole of the left kidney anteriorly.  On delayed images, the pelvocaliceal systems are unremarkable. There is mild thickening of the anterior right pararenal space which may indicate scarring or a small amount of fluid.  There is significant atheromatous change throughout the entire abdominal aorta but no focal aneurysm is noted.  No adenopathy is seen.  The terminal ileum is unremarkable and the portion of the appendix that is visualized is normal. There is degenerative disc disease throughout the lower lumbar spine.  IMPRESSION:  1.  The liver is low in attenuation with slightly nodular contours and prominence of the left lobe, findings consistent with mild cirrhosis.  No ductal dilatation is seen. 2.  Gallstones. 3.  Significant atheromatous change throughout the entire abdominal aorta. 4.  Probable pulmonary fibrosis at the lung bases.   Original Report   Authenticated By: Paul Barry, M.D.      Assessment / Plan: *1.  Acute upper GI bleed secondary to esophageal varices-resolved following band ligation 2.  cirrhosis with portal hypertension. CT demonstrates nodularity of the left lobe of the liver. Cirrhosis is probably on the basis of [place]  Recommendations 1. advance diet**  2.  continue nadolol 3.  if stable okay for discharge in a.m.  Principal Problem:   *Upper GI bleed Active Problems:  DIABETES, TYPE 2  CORONARY HEART DISEASE  ASTHMA  Thrombocytopenia  Acute posthemorrhagic anemia  Esophageal varices with bleeding  Cirrhosis of liver     LOS: 3 days   Alex Wise  03/29/2012, 12:56 PM    

## 2012-04-19 NOTE — Interval H&P Note (Signed)
History and Physical Interval Note:  04/19/2012 12:54 PM  Alex Wise  has presented today for surgery, with the diagnosis of Esophageal varices [456.1]  The various methods of treatment have been discussed with the patient and family. After consideration of risks, benefits and other options for treatment, the patient has consented to  Procedure(s) (LRB) with comments: ESOPHAGOGASTRODUODENOSCOPY (EGD) (N/A) ESOPHAGEAL BANDING (N/A) as a surgical intervention .  The patient's history has been reviewed, patient examined, no change in status, stable for surgery.  I have reviewed the patient's chart and labs.  Questions were answered to the patient's satisfaction.    The recent H&P (dated *04/19/12**) was reviewed, the patient was examined and there is no change in the patients condition since that H&P was completed.   [name]  04/19/2012, 12:54 PM    [name]

## 2012-04-20 ENCOUNTER — Encounter (HOSPITAL_COMMUNITY): Payer: Self-pay | Admitting: Gastroenterology

## 2012-04-24 ENCOUNTER — Telehealth: Payer: Self-pay | Admitting: Gastroenterology

## 2012-04-24 MED ORDER — PANTOPRAZOLE SODIUM 40 MG PO TBEC: 40.0000 mg | DELAYED_RELEASE_TABLET | Freq: Every day | ORAL | Status: DC

## 2012-04-24 MED ORDER — NADOLOL 40 MG PO TABS: 40.0000 mg | ORAL_TABLET | Freq: Every day | ORAL | Status: DC

## 2012-04-24 NOTE — Telephone Encounter (Signed)
Pt requesting refills on his nadolol and protonix. Pt also states he has had terrible hiccups ever since his procedure on 04/19/12. Pt wants to know if there is something he can take for the hiccups. Dr. Arlyce Dice please advise.

## 2012-04-24 NOTE — Telephone Encounter (Signed)
Liquids antacids as needed.

## 2012-04-24 NOTE — Telephone Encounter (Signed)
Left message for pt to call back.  Pt aware, refills sent in for pt.

## 2012-05-04 ENCOUNTER — Telehealth: Payer: Self-pay | Admitting: Gastroenterology

## 2012-05-05 ENCOUNTER — Other Ambulatory Visit: Payer: Self-pay | Admitting: Gastroenterology

## 2012-05-05 DIAGNOSIS — I8500 Esophageal varices without bleeding: Secondary | ICD-10-CM

## 2012-05-05 MED ORDER — SUCRALFATE 1 GM/10ML PO SUSP: 1.0000 g | Freq: Three times a day (TID) | ORAL | Status: DC

## 2012-05-05 NOTE — Telephone Encounter (Signed)
Increase PPI to bid for 10 days then resume qd Carafate liquid 1 g tid for 10 days

## 2012-05-05 NOTE — Telephone Encounter (Signed)
Spoke with pt and he is aware. Script sent to the pharmacy. 

## 2012-05-05 NOTE — Telephone Encounter (Signed)
Pt scheduled for EGD with banding at [facility] 05/22/12. Pt to arrive at 11:30am for a 12:30pm appt. Pt aware of prep instructions and appt date and time. Case [id number].  Pt had EGD with banding with Dr. Arlyce Dice on 04/19/12. Pt called on 04/24/12 and stated that he was having hiccups ever since procedure. Per Dr. Arlyce Dice pt was told to try antacids. Pt states today that they did not help and he still has hiccups. Dr. Russella Dar as doc of the day please advise if there is something else he can try for the hiccups.

## 2012-05-11 ENCOUNTER — Encounter (HOSPITAL_COMMUNITY): Payer: Self-pay | Admitting: *Deleted

## 2012-05-11 NOTE — Pre-Procedure Instructions (Addendum)
Your procedure is scheduled ZO:[id number], May 22, 2012 Report to [facility] Admitting [id number] Call this number if you have problems morning of your procedure:[phone]  Follow all bowel prep instructions per your doctor's orders.  Do not eat or drink anything after midnight the night before your procedure. You may brush your teeth, rinse out your mouth, but no water, no food, no chewing gum, no mints, no candies, no chewing tobacco.     Take these medicines the morning of your procedure with A SIP OF WATER:Corgard and Protonix   Please make arrangements for a responsible person to drive you home after the procedure. You cannot go home by cab/taxi. We recommend you have someone with you at home the first 24 hours after your procedure. Driver for procedure is Emiliano Dyer will give telephone the day of the procedure  LEAVE ALL VALUABLES, JEWELRY, BILLFOLD AT HOME.  NO DENTURES, CONTACT LENSES ALLOWED IN THE ENDOSCOPY ROOM.   YOU MAY WEAR DEODORANT, PLEASE REMOVE ALL JEWELRY, WATCHES RINGS, BODY PIERCINGS AND LEAVE AT HOME.   WOMEN: NO MAKE-UP, LOTIONS PERFUMES   Called Dr. Gaspar Garbe Little;s office for office visit note,diagnostic studies and EKG.

## 2012-05-17 ENCOUNTER — Encounter (HOSPITAL_COMMUNITY): Payer: Self-pay | Admitting: Pharmacy Technician

## 2012-05-22 ENCOUNTER — Ambulatory Visit (HOSPITAL_COMMUNITY): Payer: Medicare Other | Admitting: Anesthesiology

## 2012-05-22 ENCOUNTER — Encounter (HOSPITAL_COMMUNITY)
Admission: RE | Disposition: A | Discharge status: Home / Self Care | Payer: Self-pay | Source: Ambulatory Visit | Attending: Gastroenterology

## 2012-05-22 ENCOUNTER — Ambulatory Visit (HOSPITAL_COMMUNITY)
Admission: RE | Admit: 2012-05-22 | Discharge: 2012-05-22 | Disposition: A | Discharge status: Home / Self Care | Payer: Medicare Other | Source: Ambulatory Visit | Attending: Gastroenterology | Admitting: Gastroenterology

## 2012-05-22 ENCOUNTER — Encounter (HOSPITAL_COMMUNITY): Payer: Self-pay | Admitting: Anesthesiology

## 2012-05-22 ENCOUNTER — Encounter (HOSPITAL_COMMUNITY): Payer: Self-pay | Admitting: *Deleted

## 2012-05-22 DIAGNOSIS — I8500 Esophageal varices without bleeding: Secondary | ICD-10-CM | POA: Insufficient documentation

## 2012-05-22 DIAGNOSIS — K766 Portal hypertension: Secondary | ICD-10-CM | POA: Insufficient documentation

## 2012-05-22 DIAGNOSIS — Z8673 Personal history of transient ischemic attack (TIA), and cerebral infarction without residual deficits: Secondary | ICD-10-CM | POA: Insufficient documentation

## 2012-05-22 DIAGNOSIS — E119 Type 2 diabetes mellitus without complications: Secondary | ICD-10-CM | POA: Insufficient documentation

## 2012-05-22 DIAGNOSIS — J45909 Unspecified asthma, uncomplicated: Secondary | ICD-10-CM | POA: Insufficient documentation

## 2012-05-22 DIAGNOSIS — K319 Disease of stomach and duodenum, unspecified: Secondary | ICD-10-CM | POA: Insufficient documentation

## 2012-05-22 DIAGNOSIS — I2510 Atherosclerotic heart disease of native coronary artery without angina pectoris: Secondary | ICD-10-CM | POA: Insufficient documentation

## 2012-05-22 DIAGNOSIS — I252 Old myocardial infarction: Secondary | ICD-10-CM | POA: Insufficient documentation

## 2012-05-22 DIAGNOSIS — Z951 Presence of aortocoronary bypass graft: Secondary | ICD-10-CM | POA: Insufficient documentation

## 2012-05-22 DIAGNOSIS — I10 Essential (primary) hypertension: Secondary | ICD-10-CM | POA: Insufficient documentation

## 2012-05-22 HISTORY — PX: ESOPHAGEAL BANDING: SHX5518

## 2012-05-22 HISTORY — PX: ESOPHAGOGASTRODUODENOSCOPY: SHX5428

## 2012-05-22 SURGERY — EGD (ESOPHAGOGASTRODUODENOSCOPY)
Anesthesia: Monitor Anesthesia Care

## 2012-05-22 MED ORDER — PROMETHAZINE HCL 25 MG/ML IJ SOLN: 6.2500 mg | INTRAMUSCULAR | Status: DC | PRN

## 2012-05-22 MED ORDER — LACTATED RINGERS IV SOLN
INTRAVENOUS | Status: DC | PRN
  Administered 2012-05-22: 12:00:00 via INTRAVENOUS

## 2012-05-22 MED ORDER — SODIUM CHLORIDE 0.9 % IV SOLN: INTRAVENOUS | Status: DC

## 2012-05-22 MED ORDER — FENTANYL CITRATE 0.05 MG/ML IJ SOLN: 25.0000 ug | INTRAMUSCULAR | Status: DC | PRN

## 2012-05-22 MED ORDER — PROPOFOL 10 MG/ML IV EMUL
INTRAVENOUS | Status: DC | PRN
  Administered 2012-05-22: 100 ug/kg/min via INTRAVENOUS

## 2012-05-22 MED ORDER — LACTATED RINGERS IV SOLN
INTRAVENOUS | Status: DC
  Administered 2012-05-22: 12:00:00 via INTRAVENOUS

## 2012-05-22 MED ORDER — BUTAMBEN-TETRACAINE-BENZOCAINE 2-2-14 % EX AERO
INHALATION_SPRAY | CUTANEOUS | Status: DC | PRN
  Administered 2012-05-22: 2 via TOPICAL

## 2012-05-22 NOTE — Transfer of Care (Signed)
Immediate Anesthesia Transfer of Care Note  Patient: Alex Wise  Procedure(s) Performed: Procedure(s): ESOPHAGOGASTRODUODENOSCOPY (EGD) (N/A) ESOPHAGEAL BANDING (N/A)  Patient Location: PACU  Anesthesia Type:MAC  Level of Consciousness: awake, alert , oriented and patient cooperative  Airway & Oxygen Therapy: Patient Spontanous Breathing and Patient connected to nasal cannula oxygen  Post-op Assessment: Report given to PACU RN, Post -op Vital signs reviewed and stable and Patient moving all extremities X 4  Post vital signs: Reviewed and stable  Complications: No apparent anesthesia complications

## 2012-05-22 NOTE — H&P (Signed)
  History of Present Illness: 79 year old male with cirrhosis, history of esophageal variceal bleeding, status post band ligation here for followup endoscopy.    Past Medical History  Diagnosis Date  . Diabetes mellitus   . Hypertension   . Cirrhosis of liver 03/28/2012  . Esophageal varices with bleeding 03/27/2012  . Myocardial infarction     x 3  . Asthma     no medication  . Stroke     eye past carotoid surgery   Past Surgical History  Procedure Laterality Date  . Coronary artery bypass graft    . Esophagogastroduodenoscopy  03/27/2012    Procedure: ESOPHAGOGASTRODUODENOSCOPY (EGD);  Surgeon: Louis Meckel, MD;  Location: [facility];  Service: Endoscopy;  Laterality: N/A;  bedside  . Esophagogastroduodenoscopy  04/19/2012    Procedure: ESOPHAGOGASTRODUODENOSCOPY (EGD);  Surgeon: Louis Meckel, MD;  Location: [facility];  Service: Endoscopy;  Laterality: N/A;  . Esophageal banding  04/19/2012    Procedure: ESOPHAGEAL BANDING;  Surgeon: Louis Meckel, MD;  Location: [facility];  Service: Endoscopy;  Laterality: N/A;   family history is not on file. Current Facility-Administered Medications  Medication Dose Route Frequency Provider Last Rate Last Dose  . 0.9 %  sodium chloride infusion   Intravenous Continuous Louis Meckel, MD       Allergies as of 05/05/2012 - Review Complete 04/19/2012  Allergen Reaction Noted  . Codeine Nausea And Vomiting     reports that he has quit smoking. He has never used smokeless tobacco. He reports that he does not drink alcohol or use illicit drugs.     Review of Systems: Pertinent positive and negative review of systems were noted in the above HPI section. All other review of systems were otherwise negative.  Vital signs were reviewed in today's medical record Physical Exam: General: Well developed , well nourished, no acute distress Skin: anicteric Head: Normocephalic and atraumatic Eyes:  sclerae anicteric, EOMI Ears:  Normal auditory acuity Mouth: No deformity or lesions Neck: Supple, no masses or thyromegaly Lungs: Clear throughout to auscultation Heart: Regular rate and rhythm; no murmurs, rubs or bruits Abdomen: Soft, non tender and non distended. No masses, hepatosplenomegaly or hernias noted. Normal Bowel sounds Rectal:deferred Musculoskeletal: Symmetrical with no gross deformities  Skin: No lesions on visible extremities Pulses:  Normal pulses noted Extremities: No clubbing, cyanosis, edema or deformities noted Neurological: Alert oriented x 4, grossly nonfocal Cervical Nodes:  No significant cervical adenopathy Inguinal Nodes: No significant inguinal adenopathy Psychological:  Alert and cooperative. Normal mood and affect  Impression-subtle varices, status post band ligation  Plan followup endoscopy

## 2012-05-22 NOTE — Op Note (Signed)
[facility] [address] [place], [address]   ENDOSCOPY PROCEDURE REPORT  PATIENT: Alex Wise, Alex Wise  MR#: [id number] BIRTHDATE: 04/26/1931 , 80  yrs. old GENDER: Male ENDOSCOPIST: Louis Meckel, MD REFERRED BY: PROCEDURE DATE:  05/22/2012 PROCEDURE:  EGD, diagnostic ASA CLASS:     Class III INDICATIONS:  Surveillance. MEDICATIONS: MAC sedation, administered by CRNA TOPICAL ANESTHETIC:  DESCRIPTION OF PROCEDURE: After the risks benefits and alternatives of the procedure were thoroughly explained, informed consent was obtained.  The Pentax Gastroscope [id number] endoscope was introduced through the mouth and advanced to the third portion of the duodenum. Without limitations.  The instrument was slowly withdrawn as the mucosa was fully examined.      In the distal one third of the esophagus there is scarring corresponding to prior banding.  There are 1+ to 2+ varices and 2 longitudinal columns.  In the stomach there is mild erythema and enlargement of the gastric folds in the cardia and fundus consistent with portal hypertensive gastropathy.   In the distal one third of the esophagus there is scarring corresponding to prior banding.  There are 1+ to 2+ varices and 2 longitudinal columns. In the stomach there is mild erythema and enlargement of the gastric folds in the cardia and fundus consistent with portal hypertensive gastropathy.   The remainder of the upper endoscopy exam was otherwise normal.  Retroflexed views revealed no abnormalities.     The scope was then withdrawn from the patient and the procedure completed.  COMPLICATIONS: There were no complications. ENDOSCOPIC IMPRESSION: 1.  Grade 2 esophageal varices 2.  mild portal hypertensive gastropathy  RECOMMENDATIONS: Office visit next 4-6 weeks Followup  endoscopy 6-12 months REPEAT EXAM:  eSigned:  Louis Meckel, MD 05/22/2012 1:03 PM   CC:

## 2012-05-22 NOTE — Preoperative (Signed)
Beta Blockers   Reason not to administer Beta Blockers:Not Applicable 

## 2012-05-22 NOTE — Anesthesia Postprocedure Evaluation (Signed)
  Anesthesia Post-op Note  Patient: Alex Wise  Procedure(s) Performed: Procedure(s) (LRB): ESOPHAGOGASTRODUODENOSCOPY (EGD) (N/A) ESOPHAGEAL BANDING (N/A)  Patient Location: PACU  Anesthesia Type: MAC  Level of Consciousness: awake and alert   Airway and Oxygen Therapy: Patient Spontanous Breathing  Post-op Pain: mild  Post-op Assessment: Post-op Vital signs reviewed, Patient's Cardiovascular Status Stable, Respiratory Function Stable, Patent Airway and No signs of Nausea or vomiting  Last Vitals:  Filed Vitals:   05/22/12 1310  BP: 133/70  Pulse:   Temp:   Resp: 18    Post-op Vital Signs: stable   Complications: No apparent anesthesia complications

## 2012-05-22 NOTE — Anesthesia Preprocedure Evaluation (Signed)
Anesthesia Evaluation  Patient identified by MRN, date of birth, ID band Patient awake    Reviewed: Allergy & Precautions, H&P , NPO status , Patient's Chart, lab work & pertinent test results  Airway Mallampati: II TM Distance: <3 FB Neck ROM: Full    Dental no notable dental hx.    Pulmonary neg pulmonary ROS,  breath sounds clear to auscultation  Pulmonary exam normal       Cardiovascular hypertension, + CAD, + Past MI and + CABG Rhythm:Regular Rate:Normal     Neuro/Psych CVA negative psych ROS   GI/Hepatic negative GI ROS, Neg liver ROS,   Endo/Other  negative endocrine ROSdiabetes  Renal/GU negative Renal ROS  negative genitourinary   Musculoskeletal negative musculoskeletal ROS (+)   Abdominal   Peds negative pediatric ROS (+)  Hematology negative hematology ROS (+)   Anesthesia Other Findings   Reproductive/Obstetrics negative OB ROS                           Anesthesia Physical Anesthesia Plan  ASA: III  Anesthesia Plan: MAC   Post-op Pain Management:    Induction: Intravenous  Airway Management Planned: Nasal Cannula  Additional Equipment:   Intra-op Plan:   Post-operative Plan:   Informed Consent: I have reviewed the patients History and Physical, chart, labs and discussed the procedure including the risks, benefits and alternatives for the proposed anesthesia with the patient or authorized representative who has indicated his/her understanding and acceptance.     Plan Discussed with: CRNA and Surgeon  Anesthesia Plan Comments:         Anesthesia Quick Evaluation

## 2012-05-23 ENCOUNTER — Encounter (HOSPITAL_COMMUNITY): Payer: Self-pay | Admitting: Gastroenterology

## 2012-06-15 ENCOUNTER — Telehealth: Payer: Self-pay | Admitting: *Deleted

## 2012-06-15 MED ORDER — PANTOPRAZOLE SODIUM 40 MG PO TBEC: 40.0000 mg | DELAYED_RELEASE_TABLET | Freq: Two times a day (BID) | ORAL | Status: DC

## 2012-06-15 NOTE — Telephone Encounter (Signed)
Med sent to pharmacy.

## 2012-06-21 ENCOUNTER — Ambulatory Visit (INDEPENDENT_AMBULATORY_CARE_PROVIDER_SITE_OTHER): Payer: Medicare Other | Admitting: Gastroenterology

## 2012-06-21 ENCOUNTER — Encounter: Payer: Self-pay | Admitting: Gastroenterology

## 2012-06-21 VITALS — BP 132/76 | HR 80 | Ht 65.25 in | Wt 200.0 lb

## 2012-06-21 DIAGNOSIS — K7460 Unspecified cirrhosis of liver: Secondary | ICD-10-CM

## 2012-06-21 DIAGNOSIS — I8501 Esophageal varices with bleeding: Secondary | ICD-10-CM

## 2012-06-21 DIAGNOSIS — Z23 Encounter for immunization: Secondary | ICD-10-CM

## 2012-06-21 NOTE — Progress Notes (Signed)
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 Last endoscopy in February, 2014 showed grade 1 varices only. Patient remains on nadolol. Except for mild fatigue he has no GI complaints.    Review of Systems: Pertinent positive and negative review of systems were noted in the above HPI section. All other review of systems were otherwise negative.    Current Medications, Allergies, Past Medical History, Past Surgical History, Family History and Social History were reviewed in [employer] electronic medical record  Vital signs were reviewed in today's medical record. Physical Exam: General: Well developed , well nourished, no acute distress Skin: anicteric Head: Normocephalic and atraumatic Eyes:  sclerae anicteric, EOMI Ears: Normal auditory acuity Mouth: No deformity or lesions Lungs: Clear throughout to auscultation Heart: Regular rate and rhythm; no murmurs, rubs or bruits Abdomen: Soft, non tender and non distended. No masses, hepatosplenomegaly or hernias noted. Normal Bowel sounds Rectal:deferred Musculoskeletal: Symmetrical with no gross deformities  Pulses:  Normal pulses noted Extremities: No clubbing, cyanosis, edema or deformities noted Neurological: Alert oriented x 4, grossly nonfocal Psychological:  Alert and cooperative. Normal mood and affect

## 2012-06-21 NOTE — Assessment & Plan Note (Signed)
Plan followup endoscopy in 6-12 months

## 2012-06-21 NOTE — Assessment & Plan Note (Signed)
Patient has stable hepatic function. He has portal hypertension with varices, status post band ligation  Recommendations #1 continue nadolol #2 vaccinations against hepatitis A and B.

## 2012-06-21 NOTE — Patient Instructions (Addendum)
We have started your Hep A/B vaccines today  Ist Injection today 2nd injection in 7 days has been scheduled on 06/28/2012 azt 9am Next injection 21-30 days Then 4th injection on one year  You will need to follow up in 6 months You will have labs drawn before your next appointment ( CBC,CMET,AFP,INR)

## 2012-06-28 ENCOUNTER — Ambulatory Visit (INDEPENDENT_AMBULATORY_CARE_PROVIDER_SITE_OTHER): Payer: Medicare Other | Admitting: Gastroenterology

## 2012-06-28 ENCOUNTER — Other Ambulatory Visit (INDEPENDENT_AMBULATORY_CARE_PROVIDER_SITE_OTHER): Payer: Medicare Other

## 2012-06-28 DIAGNOSIS — K7460 Unspecified cirrhosis of liver: Secondary | ICD-10-CM

## 2012-06-28 DIAGNOSIS — Z23 Encounter for immunization: Secondary | ICD-10-CM

## 2012-06-28 DIAGNOSIS — I8501 Esophageal varices with bleeding: Secondary | ICD-10-CM

## 2012-06-28 LAB — COMPREHENSIVE METABOLIC PANEL
AST: 31 U/L (ref 0–37)
BUN: 15 mg/dL (ref 6–23)
Calcium: 9.7 mg/dL (ref 8.4–10.5)
Chloride: 102 mEq/L (ref 96–112)
Creatinine, Ser: 1.2 mg/dL (ref 0.4–1.5)
GFR: 60.42 mL/min (ref >60.00)

## 2012-06-28 LAB — CBC WITH DIFFERENTIAL/PLATELET
Basophils Relative: 1.6 % (ref 0.0–3.0)
Eosinophils Absolute: 0.4 10*3/uL (ref 0.0–0.7)
Hemoglobin: 13.8 g/dL (ref 13.0–17.0)
Lymphocytes Relative: 32.2 % (ref 12.0–46.0)
MCHC: 33.8 g/dL (ref 30.0–36.0)
MCV: 91.9 fl (ref 78.0–100.0)
Neutro Abs: 3 10*3/uL (ref 1.4–7.7)
RBC: 4.43 Mil/uL (ref 4.22–5.81)

## 2012-06-28 LAB — PROTIME-INR
INR: 1.2 ratio — ABNORMAL HIGH (ref 0.8–1.0)
Prothrombin Time: 12.3 s (ref 10.2–12.4)

## 2012-06-29 LAB — AFP TUMOR MARKER: AFP-Tumor Marker: <1.3 ng/mL (ref 0.0–8.0)

## 2012-07-03 ENCOUNTER — Telehealth: Payer: Self-pay | Admitting: Gastroenterology

## 2012-07-03 MED ORDER — PANTOPRAZOLE SODIUM 40 MG PO TBEC: 40.0000 mg | DELAYED_RELEASE_TABLET | Freq: Two times a day (BID) | ORAL | Status: DC

## 2012-07-03 NOTE — Telephone Encounter (Signed)
Med sent to pharmacy.

## 2012-07-04 ENCOUNTER — Telehealth: Payer: Self-pay | Admitting: Gastroenterology

## 2012-07-04 MED ORDER — PANTOPRAZOLE SODIUM 40 MG PO TBEC: 40.0000 mg | DELAYED_RELEASE_TABLET | Freq: Two times a day (BID) | ORAL | Status: DC

## 2012-07-04 NOTE — Telephone Encounter (Signed)
protonix resent

## 2012-07-12 ENCOUNTER — Ambulatory Visit (INDEPENDENT_AMBULATORY_CARE_PROVIDER_SITE_OTHER): Payer: Medicare Other | Admitting: Gastroenterology

## 2012-07-12 DIAGNOSIS — K7460 Unspecified cirrhosis of liver: Secondary | ICD-10-CM

## 2012-07-12 DIAGNOSIS — Z23 Encounter for immunization: Secondary | ICD-10-CM

## 2012-09-29 ENCOUNTER — Other Ambulatory Visit: Payer: Self-pay | Admitting: *Deleted

## 2012-09-29 MED ORDER — NADOLOL 40 MG PO TABS: 40.0000 mg | ORAL_TABLET | Freq: Every day | ORAL | Status: DC

## 2012-09-29 MED ORDER — PANTOPRAZOLE SODIUM 40 MG PO TBEC: 40.0000 mg | DELAYED_RELEASE_TABLET | Freq: Two times a day (BID) | ORAL | Status: DC

## 2012-10-12 ENCOUNTER — Encounter: Payer: Self-pay | Admitting: Gastroenterology

## 2012-10-16 ENCOUNTER — Telehealth: Payer: Self-pay | Admitting: Gastroenterology

## 2012-10-16 DIAGNOSIS — K7460 Unspecified cirrhosis of liver: Secondary | ICD-10-CM

## 2012-10-16 NOTE — Telephone Encounter (Signed)
Labs in EPIC per last OV note. Notified patient and he will come for them on the week of 11/27/12.

## 2012-12-01 ENCOUNTER — Other Ambulatory Visit: Payer: Self-pay

## 2012-12-01 ENCOUNTER — Other Ambulatory Visit (INDEPENDENT_AMBULATORY_CARE_PROVIDER_SITE_OTHER): Payer: Medicare Other

## 2012-12-01 DIAGNOSIS — K7460 Unspecified cirrhosis of liver: Secondary | ICD-10-CM

## 2012-12-01 LAB — COMPREHENSIVE METABOLIC PANEL
ALT: 20 U/L (ref 0–53)
AST: 33 U/L (ref 0–37)
Albumin: 3.7 g/dL (ref 3.5–5.2)
Alkaline Phosphatase: 71 U/L (ref 39–117)
BUN: 21 mg/dL (ref 6–23)
Calcium: 9.5 mg/dL (ref 8.4–10.5)
Chloride: 103 mEq/L (ref 96–112)
Potassium: 4.1 mEq/L (ref 3.5–5.1)
Sodium: 138 mEq/L (ref 135–145)
Total Protein: 7.2 g/dL (ref 6.0–8.3)

## 2012-12-01 LAB — PROTIME-INR: Prothrombin Time: 11.8 s (ref 10.2–12.4)

## 2012-12-01 LAB — CBC WITH DIFFERENTIAL/PLATELET
Basophils Absolute: 0 10*3/uL (ref 0.0–0.1)
Eosinophils Absolute: 0.5 10*3/uL (ref 0.0–0.7)
HCT: 40.5 % (ref 39.0–52.0)
Hemoglobin: 13.7 g/dL (ref 13.0–17.0)
Lymphs Abs: 2.1 10*3/uL (ref 0.7–4.0)
MCHC: 34 g/dL (ref 30.0–36.0)
MCV: 96.1 fl (ref 78.0–100.0)
Monocytes Relative: 7.3 % (ref 3.0–12.0)
Neutro Abs: 3.1 10*3/uL (ref 1.4–7.7)
RDW: 15.2 % — ABNORMAL HIGH (ref 11.5–14.6)

## 2012-12-05 ENCOUNTER — Encounter: Payer: Self-pay | Admitting: Gastroenterology

## 2012-12-05 ENCOUNTER — Ambulatory Visit (INDEPENDENT_AMBULATORY_CARE_PROVIDER_SITE_OTHER): Payer: Medicare Other | Admitting: Gastroenterology

## 2012-12-05 VITALS — BP 110/70 | HR 72 | Ht 62.25 in | Wt 202.6 lb

## 2012-12-05 DIAGNOSIS — I8501 Esophageal varices with bleeding: Secondary | ICD-10-CM

## 2012-12-05 DIAGNOSIS — K7460 Unspecified cirrhosis of liver: Secondary | ICD-10-CM

## 2012-12-05 NOTE — Patient Instructions (Addendum)
You will need a hospital EGD with Banding in Feb 2015

## 2012-12-05 NOTE — Progress Notes (Signed)
History of Present Illness:  The patient has returned for followup of cryptogenic cirrhosis.  He has no GI complaints.  At last endoscopy in February, 2014 grade 1 varices only were seen.  He has a history of bleeding esophageal varices which have undergone band ligation.    Review of Systems: He complains of lower extremity weakness, especially when he has to walk uphill.  Pertinent positive and negative review of systems were noted in the above HPI section. All other review of systems were otherwise negative.    Current Medications, Allergies, Past Medical History, Past Surgical History, Family History and Social History were reviewed in [employer] electronic medical record  Vital signs were reviewed in today's medical record. Physical Exam: General: Well developed , well nourished, no acute distress Skin: anicteric Head: Normocephalic and atraumatic Eyes:  sclerae anicteric, EOMI Ears: Normal auditory acuity Mouth: No deformity or lesions Lungs: Clear throughout to auscultation Heart: Regular rate and rhythm; no murmurs, rubs or bruits Abdomen: Soft, non tender and non distended. No masses, hepatosplenomegaly or hernias noted. Normal Bowel sounds Rectal:deferred Musculoskeletal: Symmetrical with no gross deformities  Pulses:  Normal pulses noted Extremities: No clubbing, cyanosis, edema or deformities noted Neurological: Alert oriented x 4, grossly nonfocal Psychological:  Alert and cooperative. Normal mood and affect

## 2012-12-05 NOTE — Assessment & Plan Note (Signed)
Followup endoscopy February, 2015

## 2012-12-05 NOTE — Assessment & Plan Note (Signed)
Stable hepatic function, history of portal hypertension and bleeding esophageal varices  Recommendations #1 followup endoscopy in 6 months #2 check alpha-fetoprotein in 6 months along with LFTs

## 2013-05-01 ENCOUNTER — Encounter: Payer: Self-pay | Admitting: Gastroenterology

## 2013-05-03 ENCOUNTER — Telehealth: Payer: Self-pay | Admitting: *Deleted

## 2013-05-03 NOTE — Telephone Encounter (Signed)
This phone number for this patient belongs to someone else now. Cant get in touch with pt

## 2013-05-16 ENCOUNTER — Telehealth: Payer: Self-pay | Admitting: Gastroenterology

## 2013-05-16 NOTE — Telephone Encounter (Signed)
Pt received recall letter to schedule EGD with possible banding at the hospital. Pt states he is not having any problems and does not know if he really needs to have this done. Also would like to know if Dr. Arlyce Dice does do this is this something that he is going to have to continue to have done throughout his life. Please advise.

## 2013-05-17 NOTE — Telephone Encounter (Signed)
Patient has had bleeding from varices in the past and has  increased risk for recurrent bleeding.  Although he feels well he is due for followup endoscopy to see if varices have recurred at which point I would repeat banding to reduce the risk for bleeding.

## 2013-07-12 ENCOUNTER — Telehealth: Payer: Self-pay | Admitting: *Deleted

## 2013-07-12 NOTE — Telephone Encounter (Signed)
Message copied by Marlowe Kays on Thu Jul 12, 2013  8:37 AM ------      Message from: Donata Duff      Created: Wed Jul 12, 2012  2:12 PM       Pt due for annual twin rix  ------

## 2013-07-24 NOTE — Telephone Encounter (Signed)
Patient needs his annual Twinrix injection    Tried to contact pt L/M

## 2013-07-25 NOTE — Telephone Encounter (Signed)
No call back from patient  He needs his annual Twinrix injection    L/M for patient again

## 2013-08-10 ENCOUNTER — Telehealth: Payer: Self-pay | Admitting: Gastroenterology

## 2013-08-13 ENCOUNTER — Other Ambulatory Visit: Payer: Self-pay | Admitting: *Deleted

## 2013-08-13 DIAGNOSIS — K7460 Unspecified cirrhosis of liver: Secondary | ICD-10-CM

## 2013-08-13 NOTE — Telephone Encounter (Signed)
Patient scheduled at [facility] on 09/27/2013 at 12:30pm  Patient aware of date time and instructiuons

## 2013-08-15 ENCOUNTER — Encounter: Payer: Self-pay | Admitting: Gastroenterology

## 2013-08-30 ENCOUNTER — Telehealth: Payer: Self-pay | Admitting: Gastroenterology

## 2013-08-30 NOTE — Telephone Encounter (Signed)
Pt states he is having abdominal/stomach pain and requests to be seen. Pt scheduled to see Willette Cluster NP tomorrow at 10:30am. Pt aware of appt.

## 2013-08-31 ENCOUNTER — Encounter: Payer: Self-pay | Admitting: Nurse Practitioner

## 2013-08-31 ENCOUNTER — Ambulatory Visit (INDEPENDENT_AMBULATORY_CARE_PROVIDER_SITE_OTHER): Payer: Medicare Other | Admitting: Nurse Practitioner

## 2013-08-31 ENCOUNTER — Inpatient Hospital Stay (HOSPITAL_COMMUNITY)
Admission: AD | Admit: 2013-08-31 | Discharge: 2013-09-06 | DRG: 372 | Disposition: A | Discharge status: Skilled Nursing Facility | Payer: Medicare Other | Source: Ambulatory Visit | Attending: Internal Medicine | Admitting: Internal Medicine

## 2013-08-31 ENCOUNTER — Telehealth: Payer: Self-pay | Admitting: Internal Medicine

## 2013-08-31 ENCOUNTER — Inpatient Hospital Stay (HOSPITAL_COMMUNITY): Payer: Medicare Other

## 2013-08-31 ENCOUNTER — Encounter (HOSPITAL_COMMUNITY): Payer: Self-pay

## 2013-08-31 VITALS — HR 66 | Ht 65.5 in | Wt 169.0 lb

## 2013-08-31 DIAGNOSIS — R188 Other ascites: Secondary | ICD-10-CM

## 2013-08-31 DIAGNOSIS — I8510 Secondary esophageal varices without bleeding: Secondary | ICD-10-CM | POA: Diagnosis present

## 2013-08-31 DIAGNOSIS — E876 Hypokalemia: Secondary | ICD-10-CM | POA: Diagnosis present

## 2013-08-31 DIAGNOSIS — R531 Weakness: Secondary | ICD-10-CM | POA: Diagnosis present

## 2013-08-31 DIAGNOSIS — E1169 Type 2 diabetes mellitus with other specified complication: Secondary | ICD-10-CM | POA: Diagnosis present

## 2013-08-31 DIAGNOSIS — Z794 Long term (current) use of insulin: Secondary | ICD-10-CM

## 2013-08-31 DIAGNOSIS — Z885 Allergy status to narcotic agent status: Secondary | ICD-10-CM

## 2013-08-31 DIAGNOSIS — J45909 Unspecified asthma, uncomplicated: Secondary | ICD-10-CM | POA: Diagnosis present

## 2013-08-31 DIAGNOSIS — Z807 Family history of other malignant neoplasms of lymphoid, hematopoietic and related tissues: Secondary | ICD-10-CM

## 2013-08-31 DIAGNOSIS — I2510 Atherosclerotic heart disease of native coronary artery without angina pectoris: Secondary | ICD-10-CM

## 2013-08-31 DIAGNOSIS — Z8041 Family history of malignant neoplasm of ovary: Secondary | ICD-10-CM

## 2013-08-31 DIAGNOSIS — Z801 Family history of malignant neoplasm of trachea, bronchus and lung: Secondary | ICD-10-CM

## 2013-08-31 DIAGNOSIS — K922 Gastrointestinal hemorrhage, unspecified: Secondary | ICD-10-CM

## 2013-08-31 DIAGNOSIS — E119 Type 2 diabetes mellitus without complications: Secondary | ICD-10-CM

## 2013-08-31 DIAGNOSIS — Z951 Presence of aortocoronary bypass graft: Secondary | ICD-10-CM

## 2013-08-31 DIAGNOSIS — K652 Spontaneous bacterial peritonitis: Principal | ICD-10-CM | POA: Diagnosis present

## 2013-08-31 DIAGNOSIS — I219 Acute myocardial infarction, unspecified: Secondary | ICD-10-CM

## 2013-08-31 DIAGNOSIS — K7460 Unspecified cirrhosis of liver: Secondary | ICD-10-CM

## 2013-08-31 DIAGNOSIS — D696 Thrombocytopenia, unspecified: Secondary | ICD-10-CM | POA: Diagnosis present

## 2013-08-31 DIAGNOSIS — R109 Unspecified abdominal pain: Secondary | ICD-10-CM

## 2013-08-31 DIAGNOSIS — K5900 Constipation, unspecified: Secondary | ICD-10-CM | POA: Diagnosis present

## 2013-08-31 DIAGNOSIS — D689 Coagulation defect, unspecified: Secondary | ICD-10-CM | POA: Diagnosis present

## 2013-08-31 DIAGNOSIS — R5381 Other malaise: Secondary | ICD-10-CM | POA: Diagnosis present

## 2013-08-31 DIAGNOSIS — Z79899 Other long term (current) drug therapy: Secondary | ICD-10-CM

## 2013-08-31 DIAGNOSIS — J449 Chronic obstructive pulmonary disease, unspecified: Secondary | ICD-10-CM

## 2013-08-31 DIAGNOSIS — K766 Portal hypertension: Secondary | ICD-10-CM | POA: Diagnosis present

## 2013-08-31 DIAGNOSIS — J309 Allergic rhinitis, unspecified: Secondary | ICD-10-CM

## 2013-08-31 DIAGNOSIS — Z87891 Personal history of nicotine dependence: Secondary | ICD-10-CM

## 2013-08-31 DIAGNOSIS — N179 Acute kidney failure, unspecified: Secondary | ICD-10-CM | POA: Diagnosis present

## 2013-08-31 DIAGNOSIS — I252 Old myocardial infarction: Secondary | ICD-10-CM

## 2013-08-31 DIAGNOSIS — R5383 Other fatigue: Secondary | ICD-10-CM

## 2013-08-31 DIAGNOSIS — R609 Edema, unspecified: Secondary | ICD-10-CM | POA: Diagnosis present

## 2013-08-31 DIAGNOSIS — Z8673 Personal history of transient ischemic attack (TIA), and cerebral infarction without residual deficits: Secondary | ICD-10-CM

## 2013-08-31 DIAGNOSIS — E871 Hypo-osmolality and hyponatremia: Secondary | ICD-10-CM | POA: Diagnosis present

## 2013-08-31 DIAGNOSIS — I10 Essential (primary) hypertension: Secondary | ICD-10-CM | POA: Diagnosis present

## 2013-08-31 DIAGNOSIS — I8501 Esophageal varices with bleeding: Secondary | ICD-10-CM

## 2013-08-31 DIAGNOSIS — Z8249 Family history of ischemic heart disease and other diseases of the circulatory system: Secondary | ICD-10-CM

## 2013-08-31 LAB — COMPREHENSIVE METABOLIC PANEL
ALT: 13 U/L (ref 0–53)
AST: 27 U/L (ref 0–37)
Albumin: 2.1 g/dL — ABNORMAL LOW (ref 3.5–5.2)
Alkaline Phosphatase: 118 U/L — ABNORMAL HIGH (ref 39–117)
BUN: 50 mg/dL — ABNORMAL HIGH (ref 6–23)
CALCIUM: 8.3 mg/dL — AB (ref 8.4–10.5)
CO2: 24 meq/L (ref 19–32)
Chloride: 87 mEq/L — ABNORMAL LOW (ref 96–112)
Creatinine, Ser: 2.46 mg/dL — ABNORMAL HIGH (ref 0.50–1.35)
GFR calc Af Amer: 27 mL/min — ABNORMAL LOW (ref >90)
GFR calc non Af Amer: 23 mL/min — ABNORMAL LOW (ref >90)
Glucose, Bld: 129 mg/dL — ABNORMAL HIGH (ref 70–99)
Potassium: 3.6 mEq/L — ABNORMAL LOW (ref 3.7–5.3)
SODIUM: 128 meq/L — AB (ref 137–147)
TOTAL PROTEIN: 6.3 g/dL (ref 6.0–8.3)
Total Bilirubin: 5.8 mg/dL — ABNORMAL HIGH (ref 0.3–1.2)

## 2013-08-31 LAB — CBC
HCT: 37.7 % — ABNORMAL LOW (ref 39.0–52.0)
HEMOGLOBIN: 13.6 g/dL (ref 13.0–17.0)
MCH: 33 pg (ref 26.0–34.0)
MCHC: 36.1 g/dL — ABNORMAL HIGH (ref 30.0–36.0)
MCV: 91.5 fL (ref 78.0–100.0)
Platelets: 172 10*3/uL (ref 150–400)
RBC: 4.12 MIL/uL — AB (ref 4.22–5.81)
RDW: 15.3 % (ref 11.5–15.5)
WBC: 14.4 10*3/uL — ABNORMAL HIGH (ref 4.0–10.5)

## 2013-08-31 LAB — BODY FLUID CELL COUNT WITH DIFFERENTIAL
Monocyte-Macrophage-Serous Fluid: 13 % — ABNORMAL LOW (ref 50–90)
NEUTROPHIL FLUID: 87 % — AB (ref 0–25)
Total Nucleated Cell Count, Fluid: 4920 cu mm — ABNORMAL HIGH (ref 0–1000)

## 2013-08-31 LAB — GLUCOSE, CAPILLARY
GLUCOSE-CAPILLARY: 104 mg/dL — AB (ref 70–99)
Glucose-Capillary: 118 mg/dL — ABNORMAL HIGH (ref 70–99)
Glucose-Capillary: 73 mg/dL (ref 70–99)

## 2013-08-31 LAB — ALBUMIN, FLUID (OTHER): Albumin, Fluid: 1 g/dL

## 2013-08-31 LAB — HEMOGLOBIN A1C
Hgb A1c MFr Bld: 6.6 % — ABNORMAL HIGH (ref <5.7)
Mean Plasma Glucose: 143 mg/dL — ABNORMAL HIGH (ref <117)

## 2013-08-31 MED ORDER — DEXTROSE 5 % IV SOLN
1.0000 g | INTRAVENOUS | Status: DC
  Filled 2013-08-31: qty 10

## 2013-08-31 MED ORDER — ACETAMINOPHEN 325 MG PO TABS: 650.0000 mg | ORAL_TABLET | Freq: Four times a day (QID) | ORAL | Status: DC | PRN

## 2013-08-31 MED ORDER — DEXTROSE 5 % IV SOLN
2.0000 g | INTRAVENOUS | Status: DC
  Administered 2013-08-31: 2 g via INTRAVENOUS
  Filled 2013-08-31: qty 2

## 2013-08-31 MED ORDER — HEPARIN SODIUM (PORCINE) 5000 UNIT/ML IJ SOLN
5000.0000 [IU] | Freq: Three times a day (TID) | INTRAMUSCULAR | Status: DC
  Administered 2013-08-31 – 2013-09-05 (×12): 5000 [IU] via SUBCUTANEOUS
  Filled 2013-08-31 (×18): qty 1

## 2013-08-31 MED ORDER — ACETAMINOPHEN 650 MG RE SUPP: 650.0000 mg | Freq: Four times a day (QID) | RECTAL | Status: DC | PRN

## 2013-08-31 MED ORDER — OXYCODONE HCL 5 MG PO TABS
5.0000 mg | ORAL_TABLET | ORAL | Status: DC | PRN
  Administered 2013-09-02 – 2013-09-03 (×2): 5 mg via ORAL
  Filled 2013-08-31 (×3): qty 1

## 2013-08-31 MED ORDER — NADOLOL 40 MG PO TABS
40.0000 mg | ORAL_TABLET | Freq: Every day | ORAL | Status: DC
  Administered 2013-09-01 – 2013-09-06 (×5): 40 mg via ORAL
  Filled 2013-08-31 (×10): qty 1

## 2013-08-31 MED ORDER — SODIUM CHLORIDE 0.9 % IV BOLUS (SEPSIS)
1000.0000 mL | Freq: Once | INTRAVENOUS | Status: DC
  Administered 2013-08-31: 1000 mL via INTRAVENOUS

## 2013-08-31 MED ORDER — INSULIN ASPART 100 UNIT/ML ~~LOC~~ SOLN
0.0000 [IU] | Freq: Three times a day (TID) | SUBCUTANEOUS | Status: DC
  Administered 2013-09-02: 2 [IU] via SUBCUTANEOUS
  Administered 2013-09-03: 3 [IU] via SUBCUTANEOUS
  Administered 2013-09-03: 1 [IU] via SUBCUTANEOUS
  Administered 2013-09-04: 2 [IU] via SUBCUTANEOUS
  Administered 2013-09-04: 5 [IU] via SUBCUTANEOUS
  Administered 2013-09-04 – 2013-09-05 (×3): 2 [IU] via SUBCUTANEOUS

## 2013-08-31 MED ORDER — MORPHINE SULFATE 2 MG/ML IJ SOLN: 1.0000 mg | INTRAMUSCULAR | Status: DC | PRN

## 2013-08-31 MED ORDER — CEFOTAXIME SODIUM 1 G IJ SOLR
2.0000 g | Freq: Three times a day (TID) | INTRAMUSCULAR | Status: DC
  Administered 2013-08-31 – 2013-09-04 (×10): 2 g via INTRAVENOUS
  Filled 2013-08-31 (×14): qty 2

## 2013-08-31 MED ORDER — INSULIN ASPART PROT & ASPART (70-30 MIX) 100 UNIT/ML ~~LOC~~ SUSP
40.0000 [IU] | Freq: Two times a day (BID) | SUBCUTANEOUS | Status: DC
  Administered 2013-08-31: 40 [IU] via SUBCUTANEOUS
  Filled 2013-08-31: qty 10

## 2013-08-31 MED ORDER — ONDANSETRON HCL 4 MG/2ML IJ SOLN: 4.0000 mg | Freq: Four times a day (QID) | INTRAMUSCULAR | Status: DC | PRN

## 2013-08-31 MED ORDER — PANTOPRAZOLE SODIUM 40 MG PO TBEC
40.0000 mg | DELAYED_RELEASE_TABLET | Freq: Two times a day (BID) | ORAL | Status: DC
  Administered 2013-08-31 – 2013-09-06 (×12): 40 mg via ORAL
  Filled 2013-08-31 (×13): qty 1

## 2013-08-31 MED ORDER — ONDANSETRON HCL 4 MG PO TABS: 4.0000 mg | ORAL_TABLET | Freq: Four times a day (QID) | ORAL | Status: DC | PRN

## 2013-08-31 MED ORDER — ALBUMIN HUMAN 25 % IV SOLN
100.0000 g | Freq: Once | INTRAVENOUS | Status: AC
  Administered 2013-08-31: 100 g via INTRAVENOUS
  Filled 2013-08-31: qty 400

## 2013-08-31 MED ORDER — FUROSEMIDE 20 MG PO TABS
20.0000 mg | ORAL_TABLET | Freq: Every day | ORAL | Status: DC
  Filled 2013-08-31: qty 1

## 2013-08-31 MED ORDER — SPIRONOLACTONE 50 MG PO TABS
50.0000 mg | ORAL_TABLET | Freq: Every day | ORAL | Status: DC
  Filled 2013-08-31: qty 1

## 2013-08-31 MED ORDER — LORAZEPAM 0.5 MG PO TABS
0.5000 mg | ORAL_TABLET | Freq: Once | ORAL | Status: AC
  Administered 2013-08-31: 0.5 mg via ORAL
  Filled 2013-08-31: qty 1

## 2013-08-31 NOTE — Telephone Encounter (Signed)
PENDING ACCEPTANCE TRANFER NOTE:  Call received from:    Meredith Pel, NP at [facility] gastroenterology  REASON FOR REQUESTING TRANSFER:    Ascites and abdominal pain  HPI:   80 year old male with cryptogenic cirrhosis likely secondary to Elita Boone seen in his gastroenterologist office today with abdominal pain, poor appetite, has new ascites. Cannot rule out SBP. Patient will be admitted to the hospital, telemetry.  PLAN:  According to telephone report, this patient was accepted for transfer to [facility],   Under [facility] team:  [facility],  I have requested an order be written to call Flow Manager at [phone] upon patient arrival to the floor for final physician assignment who will do the admission and give admitting orders.  SIGNED: Clydia Llano, MD [facility]  08/31/2013, 11:53 AM

## 2013-08-31 NOTE — Procedures (Signed)
US guided diagnostic/therapeutic paracentesis performed yielding 5.6 liters turbid, amber fluid. A portion of the fluid was sent to the lab for preordered studies. No immediate complications. Dr. Arthor Captain informed.

## 2013-08-31 NOTE — H&P (Signed)
[facility] History and Physical  Alex Wise Speaker ZOX:[id number] DOB: 1931/07/10 DOA: 08/31/2013  Referring physician: Meredith PelPaula M Guenther, NP PCP: Dorrene GermanAVBUERE,EDWIN A, MD   Chief Complaint: Abdominal pain  HPI: Alex Wise Jeanpaul is a 80 y.o. male with past medical history of cryptogenic cirrhosis, HTN and IDDM. Patient sent from has gastroenterologist office today because of abdominal pain. Patient said for the past several weeks he has diffuse abdominal pain. The pain started as periumbilical and then involve all his abdomen, which was started it was 2-3/10 and now it is 5-6/10, patient seen in his GI office today and he sent to the hospital because of ascites and abdominal pain and cannot rule out SBP. Patient admi directly to the hospital, paracentesis will be done f diagnostic purposes.  Review of Systems:  Constitutional: Has nausea Eyes: negative for irritation, redness and visual disturbance Ears, nose, mouth, throat, and face: negative for earaches, epistaxis, nasal congestion and sore throat Respiratory: negative for cough, dyspnea on exertion, sputum and wheezing Cardiovascular: negative for chest pain, dyspnea, lower extremity edema, orthopnea, palpitations and syncope Gastrointestinal: negative for abdominal pain, constipation, diarrhea, melena, nausea and vomiting Genitourinary:negative for dysuria, frequency and hematuria Hematologic/lymphatic: negative for bleeding, easy bruising and lymphadenopathy Musculoskeletal:negative for arthralgias, muscle weakness and stiff joints Neurological: negative for coordination problems, gait problems, headaches and weakness Endocrine: negative for diabetic symptoms including polydipsia, polyuria and weight loss Allergic/Immunologic: negative for anaphylaxis, hay fever and urticaria  Past Medical History  Diagnosis Date  . Diabetes mellitus   . Hypertension   . Cirrhosis of liver 03/28/2012  . Esophageal varices with bleeding(456.0)  03/27/2012  . Myocardial infarction     x 3  . Asthma     no medication  . Stroke     eye past carotoid surgery  . Portal hypertensive gastropathy    Past Surgical History  Procedure Laterality Date  . Coronary artery bypass graft    . Esophagogastroduodenoscopy  03/27/2012    Procedure: ESOPHAGOGASTRODUODENOSCOPY (EGD);  Surgeon: Louis Meckelobert Wise Kaplan, MD;  [facility];  Service: Endoscopy;  Laterality: N/A;  bedside  . Esophagogastroduodenoscopy  04/19/2012    Procedure: ESOPHAGOGASTRODUODENOSCOPY (EGD);  Surgeon: Louis Meckelobert Wise Kaplan, MD;  [facility];  Service: Endoscopy;  Laterality: N/A;  . Esophageal banding  04/19/2012    Procedure: ESOPHAGEAL BANDING;  Surgeon: Louis Meckelobert Wise Kaplan, MD;  [facility];  Service: Endoscopy;  Laterality: N/A;  . Esophagogastroduodenoscopy N/A 05/22/2012    Procedure: ESOPHAGOGASTRODUODENOSCOPY (EGD);  Surgeon: Louis Meckelobert Wise Kaplan, MD;  [facility];  Service: Endoscopy;  Laterality: N/A;  . Esophageal banding N/A 05/22/2012    Procedure: ESOPHAGEAL BANDING;  Surgeon: Louis Meckelobert Wise Kaplan, MD;  [facility];  Service: Endoscopy;  Laterality: N/A;   Social History:   reports that he quit smoking about 31 years ago. His smoking use included Cigarettes. He has a 2 pack-year smoking history. He has never used smokeless tobacco. He reports that he does not drink alcohol or use illicit drugs.  Allergies  Allergen Reactions  . Codeine Nausea And Vomiting    dizziness    Family History  Problem Relation Age of Onset  . Lymphoma Brother   . Lung cancer Father   . Ovarian cancer Sister   . Heart attack Mother      Prior to Admission medications   Medication Sig Start Date End Date Taking? Authorizing Provider  insulin NPH-insulin regular (NOVOLIN 70/30) (70-30) 100 UNIT/ML injection Inject 60 Units into the skin  2 (two) times daily with a meal.    Yes Historical Provider, MD  nadolol (CORGARD) 40 MG tablet Take 1 tablet  (40 mg total) by mouth daily before breakfast. 09/29/12  Yes Louis Meckel, MD  pantoprazole (PROTONIX) 40 MG tablet Take 1 tablet (40 mg total) by mouth 2 (two) times daily. 09/29/12  Yes Louis Meckel, MD   Physical Exam: Filed Vitals:   08/31/13 1249  BP: 93/54  Pulse: 66  Temp: 97.6 F (36.4 C)  Resp: 16   Constitutional: Oriented to person, place, and time. Well-developed and well-nourished. Cooperative.  Head: Normocephalic and atraumatic.  Nose: Nose normal.  Mouth/Throat: Uvula is midline, oropharynx is clear and moist and mucous membranes are normal.  Eyes: Conjunctivae and EOM are normal. Pupils are equal, round, and reactive to light.  Neck: Trachea normal and normal range of motion. Neck supple.  Cardiovascular: Normal rate, regular rhythm, S1 normal, S2 normal, normal heart sounds and intact distal pulses.   Pulmonary/Chest: Effort normal and breath sounds normal.  Abdominal: Soft. Bowel sounds are normal. There is no hepatosplenomegaly. There is no tenderness.  Musculoskeletal: Normal range of motion.  Neurological: Alert and oriented to person, place, and time. He has normal strength. No cranial nerve deficit or sensory deficit.  Skin: Skin is warm, dry and intact.  Psychiatric: Has a normal mood and affect. Speech is normal and behavior is normal.   Labs on Admission:  Basic Metabolic Panel: No results found for this basename: NA, K, CL, CO2, GLUCOSE, BUN, CREATININE, CALCIUM, MG, PHOS,  in the last 168 hours Liver Function Tests: No results found for this basename: AST, ALT, ALKPHOS, BILITOT, PROT, ALBUMIN,  in the last 168 hours No results found for this basename: LIPASE, AMYLASE,  in the last 168 hours No results found for this basename: AMMONIA,  in the last 168 hours CBC:  Recent Labs Lab 08/31/13 1350  WBC 14.4*  HGB 13.6  HCT 37.7*  MCV 91.5  PLT 172   Cardiac Enzymes: No results found for this basename: CKTOTAL, CKMB, CKMBINDEX, TROPONINI,  in  the last 168 hours  BNP (last 3 results) No results found for this basename: PROBNP,  in the last 8760 hours CBG:  Recent Labs Lab 08/31/13 1304  GLUCAP 118*    Radiological Exams on Admission: No results found.  EKG: Independently reviewed.   Assessment/Plan Principal Problem:   Abdominal pain Active Problems:   DIABETES, TYPE 2   Cirrhosis of liver   Ascites   Generalized weakness    Abdominal pain -Diffuse abdominal pain, started in periumbilical area. -Differential diagnoses include SBP and stretching of the liver capsule. -Obtain LFTs, and paracentesis to rule out SBP.  Ascites -Secondary to cirrhosis of the liver, patient not on diuretics. -Hold on starting diuresis as patient has acute renal failure. -Has +1 pedal edema.  Cirrhosis of the liver -Per GI notes, cryptogenic cirrhosis likely NASH. -Patient is on beta blockers, he is AST and ALT within normal limits. -Hyponatremia and hypoalbuminemia secondary to cirrhosis.  Diabetes mellitus type 2 -Patient is on 70/30 insulin mix, decrease the dose from 60 to 40 while is in the hospital. -Check hemoglobin A1c, start carb modified diet after the paracentesis.  Acute renal failure -Last records of his creatinine was in August of 2014 which was 1.3. -Follow creatinine closely, if not going down or worsening patient will need nephrology consultation.  Code Status:  Full code Family Communication:  Discussed with patient.  Disposition  Plan:  Inpatient, MedSurg  Time spent: 70 minutes  [facility] Pager [phone]

## 2013-08-31 NOTE — Progress Notes (Signed)
ANTIBIOTIC CONSULT NOTE - INITIAL  Pharmacy Consult for Rocephin Indication: Spontaneous Bacterial Peritonitis  Allergies  Allergen Reactions  . Codeine Nausea And Vomiting    dizziness    Patient Measurements: Height: 5' 5.5" (166.4 cm) Weight: 168 lb 6.9 oz (76.4 kg) IBW/kg (Calculated) : 62.65  Vital Signs: Temp: 97.6 F (36.4 C) (05/29 1249) Temp src: Oral (05/29 1249) BP: 96/54 mmHg (05/29 1454) Pulse Rate: 66 (05/29 1249) Intake/Output from previous day:   Intake/Output from this shift:    Labs:  Recent Labs  08/31/13 1350  WBC 14.4*  HGB 13.6  PLT 172  CREATININE 2.46*   Estimated Creatinine Clearance: 23.5 ml/min (by C-G formula based on Cr of 2.46). No results found for this basename: VANCOTROUGH, VANCOPEAK, VANCORANDOM, GENTTROUGH, GENTPEAK, GENTRANDOM, TOBRATROUGH, TOBRAPEAK, TOBRARND, AMIKACINPEAK, AMIKACINTROU, AMIKACIN,  in the last 72 hours   Microbiology: No results found for this or any previous visit (from the past 720 hour(s)).  Medical History: Past Medical History  Diagnosis Date  . Diabetes mellitus   . Hypertension   . Cirrhosis of liver 03/28/2012  . Esophageal varices with bleeding(456.0) 03/27/2012  . Myocardial infarction     x 3  . Asthma     no medication  . Stroke     eye past carotoid surgery  . Portal hypertensive gastropathy     Medications:  Scheduled:  . cefTRIAXone (ROCEPHIN)  IV  2 g Intravenous Q24H  . heparin  5,000 Units Subcutaneous 3 times per day  . insulin aspart  0-9 Units Subcutaneous TID WC  . insulin aspart protamine- aspart  40 Units Subcutaneous BID WC  . [START ON 09/01/2013] nadolol  40 mg Oral QAC breakfast  . pantoprazole  40 mg Oral BID   Infusions:   PRN: acetaminophen, acetaminophen, morphine injection, ondansetron (ZOFRAN) IV, ondansetron, oxyCODONE Assessment: 84 yom with PMH of cryptogenic cirrhosis, HTN and IDDM. Patient was sent to hospital from GI office because of ascites and  abdominal pain. Admitted for paracentesis. Pharmacy consulted to dose Rocephin after paracentesis.  5/29 >>Rocephin >>  Tmax: afebrile WBCs:14.4 Renal: SCr 2.46 CrCl 16ml/min CG  5/28 blood: sent 5/28 body fluid: sent     Goal of Therapy:  Appropriate antibiotic dosing for renal function; eradication of infection   Plan:  Start Rocephin 2g IV Q24H Follow up culture results  Loma Boston, PharmD Pager: [phone] 08/31/2013 3:16 PM

## 2013-08-31 NOTE — Progress Notes (Signed)
Reviewed and agree with management. Robert D. Kaplan, M.D., FACG  

## 2013-08-31 NOTE — Progress Notes (Signed)
GI ATTENDING (evening follow up)  The patient has SBP. Will give IV albumin and start IV cefotaxime.  Wilhemina Bonito. Eda Keys., M.D. [facility] Division of Gastroenterology

## 2013-08-31 NOTE — Consult Note (Signed)
Consultation  Referring Provider:  [facility]     Primary Care Physician:  Dorrene German, MD Primary Gastroenterologist:   [name], MD      Reason for Consultation:  Decompensated cirrhosis            HPI:    I saw patient in office today. He appears to have decompensated cirrhosis and I am concerned about SBP. Office note cut and pasted below:   Patient is a 80 year old male known to Dr. Arlyce Dice for history of cryptogenic cirrhosis. Cirrhosis complicated by portal HTN with bleeding varices December 2013. Last EGD Feb 2014 showed only grade1 varices. Patient was last seen in September 2014. He was due for surveillance EGD in April of this year. Last AFP late August 2014 was normal.  Patient here for evaluation of abdominal pain. Patient has never had this pain before. It is diffuse and almost constant for 1.5 weeks. Pain worse if he lays down. It hurts to cough or even laugh. Appetite is poor and he reports recent weight loss. He has generalized weakness .No fever / chills. No vomiting. No blood in stool.   Past Medical History  Diagnosis Date  . Diabetes mellitus   . Hypertension   . Cirrhosis of liver 03/28/2012  . Esophageal varices with bleeding(456.0) 03/27/2012  . Myocardial infarction     x 3  . Asthma     no medication  . Stroke     eye past carotoid surgery  . Portal hypertensive gastropathy     Past Surgical History  Procedure Laterality Date  . Coronary artery bypass graft    . Esophagogastroduodenoscopy  03/27/2012    Procedure: ESOPHAGOGASTRODUODENOSCOPY (EGD);  Surgeon: Louis Meckel, MD;  Location: [facility];  Service: Endoscopy;  Laterality: N/A;  bedside  . Esophagogastroduodenoscopy  04/19/2012    Procedure: ESOPHAGOGASTRODUODENOSCOPY (EGD);  Surgeon: Louis Meckel, MD;  Location: [facility];  Service: Endoscopy;  Laterality: N/A;  . Esophageal banding  04/19/2012    Procedure: ESOPHAGEAL BANDING;  Surgeon: Louis Meckel, MD;   Location: [facility];  Service: Endoscopy;  Laterality: N/A;  . Esophagogastroduodenoscopy N/A 05/22/2012    Procedure: ESOPHAGOGASTRODUODENOSCOPY (EGD);  Surgeon: Louis Meckel, MD;  Location: [facility];  Service: Endoscopy;  Laterality: N/A;  . Esophageal banding N/A 05/22/2012    Procedure: ESOPHAGEAL BANDING;  Surgeon: Louis Meckel, MD;  Location: [facility];  Service: Endoscopy;  Laterality: N/A;    Family History  Problem Relation Age of Onset  . Lymphoma Brother   . Lung cancer Father   . Ovarian cancer Sister   . Heart attack Mother      History  Substance Use Topics  . Smoking status: Former Smoker -- 1.00 packs/day for 2 years    Types: Cigarettes    Quit date: 04/05/1982  . Smokeless tobacco: Never Used  . Alcohol Use: No    Prior to Admission medications   Medication Sig Start Date End Date Taking? Authorizing Provider  insulin NPH-insulin regular (NOVOLIN 70/30) (70-30) 100 UNIT/ML injection Inject 60 Units into the skin 2 (two) times daily with a meal.    Yes Historical Provider, MD  nadolol (CORGARD) 40 MG tablet Take 1 tablet (40 mg total) by mouth daily before breakfast. 09/29/12  Yes Louis Meckel, MD  pantoprazole (PROTONIX) 40 MG tablet Take 1 tablet (40 mg total) by mouth 2 (two) times daily. 09/29/12  Yes Louis Meckel, MD  Current Facility-Administered Medications  Medication Dose Route Frequency Provider Last Rate Last Dose  . acetaminophen (TYLENOL) tablet 650 mg  650 mg Oral Q6H PRN Clydia Llano, MD       Or  . acetaminophen (TYLENOL) suppository 650 mg  650 mg Rectal Q6H PRN Clydia Llano, MD      . furosemide (LASIX) tablet 20 mg  20 mg Oral Daily Mutaz Elmahi, MD      . heparin injection 5,000 Units  5,000 Units Subcutaneous 3 times per day Clydia Llano, MD      . insulin aspart (novoLOG) injection 0-9 Units  0-9 Units Subcutaneous TID WC Mutaz Elmahi, MD      . insulin aspart protamine- aspart (NOVOLOG MIX 70/30) injection 40 Units  40  Units Subcutaneous BID WC Mutaz Elmahi, MD      . morphine 2 MG/ML injection 1 mg  1 mg Intravenous Q4H PRN Clydia Llano, MD      . Melene Muller ON 09/01/2013] nadolol (CORGARD) tablet 40 mg  40 mg Oral QAC breakfast Clydia Llano, MD      . ondansetron (ZOFRAN) tablet 4 mg  4 mg Oral Q6H PRN Clydia Llano, MD       Or  . ondansetron (ZOFRAN) injection 4 mg  4 mg Intravenous Q6H PRN Clydia Llano, MD      . oxyCODONE (Oxy IR/ROXICODONE) immediate release tablet 5 mg  5 mg Oral Q4H PRN Clydia Llano, MD      . pantoprazole (PROTONIX) EC tablet 40 mg  40 mg Oral BID Clydia Llano, MD      . spironolactone (ALDACTONE) tablet 50 mg  50 mg Oral Daily Clydia Llano, MD        Allergies as of 08/31/2013 - Review Complete 08/31/2013  Allergen Reaction Noted  . Codeine Nausea And Vomiting    Review of Systems:    All systems reviewed and negative except where noted in HPI.   Physical Exam:  Vital signs in last 24 hours: Temp:  [97.6 F (36.4 C)] 97.6 F (36.4 C) (05/29 1249) Pulse Rate:  [66] 66 (05/29 1249) Resp:  [16] 16 (05/29 1249) BP: (93)/(54) 93/54 mmHg (05/29 1249) SpO2:  [94 %] 94 % (05/29 1249) Weight:  [168 lb 6.9 oz (76.4 kg)-169 lb (76.658 kg)] 168 lb 6.9 oz (76.4 kg) (05/29 1249)   General: Pleasant, chronically ill appearing 80 year old white male in no acute distress  Head: Normocephalic and atraumatic  Eyes: sclerae appear icteric.  Ears: Normal auditory acuity  Lungs: Faint wheeze and chest . Otherwise lungs clear to auscultation.  Heart: Occasional 2-3 seconds pause  Abdomen: Soft, moderately distended , diffusely tender , hypoactive bowel sounds  Musculoskeletal: Symmetrical with no gross deformities  Extremities: 2 + bilateral lower extremity edema Neurological: Alert oriented x 4, grossly nonfocal. No asterixis  Psychological: Alert and cooperative. Normal mood and affect    Impression / Plan:  1. Pleasant 80 year old male with cryptogenic cirrhosis complicated by portal  hypertension. Patient is status post EGD with banding of varices for bleeding December 2013. It is not clear whether patient is taking prescribed nadolol. Suspect he has decompensated cirrhosis with new development of ascites, seen #2.   2. Diffuse abdominal pain / weight loss/ weakness. No history of ascites but on exam he does appear to have ascites now . Abdomen is diffusely tender. We need to rule out SBP. Patient needs to be admitted to the hospital for further evaluation and treatment. He will need an ultrasound-guided  paracentesis. Following that would treat empirically for SBP (IV Rocephin is fine). He will obviously need basic labs, pain control. [facility] has kindly agreed to admit the patient, we will follow along. [facility] Further recommendations pending clinical course.   3. Hx of CAD / CABG 1993. Last cardiac cath was August 2012, see report for details.   4. Hx of chronic lung disease, ? Asthma   5. Diabetes, on insulin    Thanks   LOS: 0 days   Meredith Pelaula M Guenther  08/31/2013, 2:20 PM  GI Attending Note   Chart was reviewed and patient was examined. X-rays and lab were reviewed.    I agree with management and plans.  Patient likely has as BP and decompensated cirrhosis.  The latter may be due to the former.  Plan as outlined above.   Barbette Hairobert D. Arlyce DiceKaplan, M.D., [facility] [phone]

## 2013-08-31 NOTE — Progress Notes (Signed)
     History of Present Illness:  Patient is a 80 year old male known to Dr. Arlyce Dice for history of cryptogenic cirrhosis.  Cirrhosis complicated by portal HTN with bleeding varices December 2013. Last EGD Feb 2014 showed only grade1 varices. Patient was last seen in September 2014. He was due for surveillance EGD in April of this year. Last AFP late August 2014 was normal.   Patient here for evaluation of abdominal pain. Patient has never had this pain before. It is diffuse and almost constant  for 1.5 weeks. Pain  worse if he lays down. It hurts to cough or even laugh. Appetite is poor and he reports recent weight loss. He has generalized weakness .No fever / chills. No vomiting.  No blood in stool.   Current Medications, Allergies, Past Medical History, Past Surgical History, Family History and Social History were reviewed in [employer] record.  Physical Exam: General: Pleasant, chronically ill appearing 80 year old white male in no acute distress Head: Normocephalic and atraumatic Eyes:  sclerae appear icteric.  Ears: Normal auditory acuity Lungs: Faint wheeze and chest . Otherwise lungs clear to auscultation.  Heart: Occasional 2-3 seconds pause Abdomen: Soft, moderately distended , diffusely tender , hypoactive bowel sounds  Musculoskeletal: Symmetrical with no gross deformities  Extremities: 2 + bilateral lower extremity edema Neurological: Alert oriented x 4, grossly nonfocal. No asterixis Psychological:  Alert and cooperative. Normal mood and affect  Assessment and Recommendations:  1. pleasant 80 year old male with cryptogenic cirrhosis complicated by portal hypertension. Patient is status post EGD with banding of varices for bleeding December 2013. It is not clear whether patient is taking prescribed nadolol. Suspect he has decompensated with new development of ascites, seen #2.  2. diffuse abdominal pain / weight loss/ weakness. No history of ascites but  on exam he does appear to have ascites now . Abdomen is diffusely tender. We need to rule out SBP. Patient needs to be admitted to the hospital for further evaluation and treatment. He will need an ultrasound-guided paracentesis. Following that would treat empirically for SBP (IV Rocephin is fine). He will obviously need basic labs, pain control.  [facility] has kindly agreed to admit the patient, we will follow along. I have notified our inpatient team at [facility]. Further recommendations pending clinical course.   3. Hx of CAD / CABG 1993. Last cardiac cath was August 2012, see report for details.   4. Hx of chronic lung disease, ? Asthma  5. Diabetes, on insulin

## 2013-08-31 NOTE — Patient Instructions (Signed)
Go to [facility], go directly to admitting.

## 2013-09-01 DIAGNOSIS — R5381 Other malaise: Secondary | ICD-10-CM

## 2013-09-01 DIAGNOSIS — I219 Acute myocardial infarction, unspecified: Secondary | ICD-10-CM

## 2013-09-01 DIAGNOSIS — J309 Allergic rhinitis, unspecified: Secondary | ICD-10-CM

## 2013-09-01 DIAGNOSIS — K652 Spontaneous bacterial peritonitis: Principal | ICD-10-CM

## 2013-09-01 DIAGNOSIS — R5383 Other fatigue: Secondary | ICD-10-CM

## 2013-09-01 DIAGNOSIS — R109 Unspecified abdominal pain: Secondary | ICD-10-CM

## 2013-09-01 DIAGNOSIS — I2510 Atherosclerotic heart disease of native coronary artery without angina pectoris: Secondary | ICD-10-CM

## 2013-09-01 DIAGNOSIS — R188 Other ascites: Secondary | ICD-10-CM

## 2013-09-01 DIAGNOSIS — I8501 Esophageal varices with bleeding: Secondary | ICD-10-CM

## 2013-09-01 LAB — GLUCOSE, CAPILLARY
GLUCOSE-CAPILLARY: 26 mg/dL — AB (ref 70–99)
GLUCOSE-CAPILLARY: 27 mg/dL — AB (ref 70–99)
GLUCOSE-CAPILLARY: 89 mg/dL (ref 70–99)
Glucose-Capillary: 73 mg/dL (ref 70–99)
Glucose-Capillary: 42 mg/dL — CL (ref 70–99)
Glucose-Capillary: 33 mg/dL — CL (ref 70–99)
Glucose-Capillary: 72 mg/dL (ref 70–99)
Glucose-Capillary: 72 mg/dL (ref 70–99)
Glucose-Capillary: 86 mg/dL (ref 70–99)
Glucose-Capillary: 95 mg/dL (ref 70–99)

## 2013-09-01 LAB — BASIC METABOLIC PANEL
BUN: 50 mg/dL — ABNORMAL HIGH (ref 6–23)
BUN: 48 mg/dL — AB (ref 6–23)
CALCIUM: 8.4 mg/dL (ref 8.4–10.5)
CO2: 25 meq/L (ref 19–32)
CO2: 24 mEq/L (ref 19–32)
CREATININE: 1.92 mg/dL — AB (ref 0.50–1.35)
Calcium: 8.6 mg/dL (ref 8.4–10.5)
Chloride: 91 mEq/L — ABNORMAL LOW (ref 96–112)
Chloride: 90 mEq/L — ABNORMAL LOW (ref 96–112)
Creatinine, Ser: 2.08 mg/dL — ABNORMAL HIGH (ref 0.50–1.35)
GFR calc non Af Amer: 29 mL/min — ABNORMAL LOW (ref >90)
GFR, EST AFRICAN AMERICAN: 33 mL/min — AB (ref >90)
GFR, EST AFRICAN AMERICAN: 37 mL/min — AB (ref >90)
GFR, EST NON AFRICAN AMERICAN: 32 mL/min — AB (ref >90)
Glucose, Bld: 43 mg/dL — CL (ref 70–99)
Glucose, Bld: 62 mg/dL — ABNORMAL LOW (ref 70–99)
Potassium: 3.2 mEq/L — ABNORMAL LOW (ref 3.7–5.3)
Potassium: 3.8 mEq/L (ref 3.7–5.3)
Sodium: 131 mEq/L — ABNORMAL LOW (ref 137–147)
Sodium: 132 mEq/L — ABNORMAL LOW (ref 137–147)

## 2013-09-01 LAB — CBC
HCT: 34.8 % — ABNORMAL LOW (ref 39.0–52.0)
Hemoglobin: 12.5 g/dL — ABNORMAL LOW (ref 13.0–17.0)
MCH: 33.2 pg (ref 26.0–34.0)
MCHC: 35.9 g/dL (ref 30.0–36.0)
MCV: 92.3 fL (ref 78.0–100.0)
Platelets: 125 10*3/uL — ABNORMAL LOW (ref 150–400)
RBC: 3.77 MIL/uL — AB (ref 4.22–5.81)
RDW: 15.4 % (ref 11.5–15.5)
WBC: 12.3 10*3/uL — ABNORMAL HIGH (ref 4.0–10.5)

## 2013-09-01 LAB — PROTIME-INR
INR: 1.66 — ABNORMAL HIGH (ref 0.00–1.49)
PROTHROMBIN TIME: 19.1 s — AB (ref 11.6–15.2)

## 2013-09-01 MED ORDER — DEXTROSE 50 % IV SOLN
INTRAVENOUS | Status: AC
  Administered 2013-09-01: 25 mL
  Filled 2013-09-01: qty 50

## 2013-09-01 MED ORDER — GLUCAGON HCL RDNA (DIAGNOSTIC) 1 MG IJ SOLR
INTRAMUSCULAR | Status: AC
  Administered 2013-09-01: 12:00:00
  Filled 2013-09-01: qty 1

## 2013-09-01 MED ORDER — DEXTROSE 5 % IV SOLN
INTRAVENOUS | Status: DC
  Administered 2013-09-01 – 2013-09-02 (×2): via INTRAVENOUS

## 2013-09-01 MED ORDER — INSULIN ASPART PROT & ASPART (70-30 MIX) 100 UNIT/ML ~~LOC~~ SUSP: 20.0000 [IU] | Freq: Two times a day (BID) | SUBCUTANEOUS | Status: DC

## 2013-09-01 MED ORDER — GLUCOSE 40 % PO GEL
ORAL | Status: AC
  Administered 2013-09-01: 12:00:00
  Filled 2013-09-01: qty 1

## 2013-09-01 MED ORDER — POTASSIUM CHLORIDE CRYS ER 20 MEQ PO TBCR
40.0000 meq | EXTENDED_RELEASE_TABLET | Freq: Four times a day (QID) | ORAL | Status: AC
  Administered 2013-09-01 (×2): 40 meq via ORAL
  Filled 2013-09-01 (×2): qty 2

## 2013-09-01 MED ORDER — GLUCAGON HCL (RDNA) 1 MG IJ SOLR
0.5000 mg | Freq: Once | INTRAMUSCULAR | Status: AC | PRN
  Filled 2013-09-01: qty 1

## 2013-09-01 MED ORDER — GLUCAGON HCL (RDNA) 1 MG IJ SOLR
1.0000 mg | Freq: Once | INTRAMUSCULAR | Status: AC | PRN
  Administered 2013-09-01: 1 mg via INTRAMUSCULAR
  Filled 2013-09-01: qty 1

## 2013-09-01 NOTE — Progress Notes (Signed)
[facility] Progress Note  Subjective:  Still feels weak and abdominal pain is about the same.  Paracentesis with 5.6 Liters of turbid fluid removed on 5/29.  Objective:  Vital signs in last 24 hours: Temp:  [95.8 F (35.4 C)-98.6 F (37 C)] 98.6 F (37 C) (05/30 0636) Pulse Rate:  [66-73] 71 (05/30 0636) Resp:  [14-16] 14 (05/30 0636) BP: (80-118)/(43-61) 112/57 mmHg (05/30 0636) SpO2:  [93 %-98 %] 93 % (05/30 0636) Weight:  [168 lb 6.9 oz (76.4 kg)-169 lb (76.658 kg)] 168 lb 6.9 oz (76.4 kg) (05/29 1249) Last BM Date: 08/31/13 General:  Alert, chronically ill-appearing, in NAD Heart:  Regular rate and rhythm; no murmurs Pulm:  CTAB.  No W/R/R. Abdomen:  Soft, distended.  BS present.  Diffuse TTP. Extremities:  Without edema. Neurologic:  Alert and  oriented x4;  grossly normal neurologically. Psych:  Alert and cooperative. Normal mood and affect.  Intake/Output from previous day: 05/29 0701 - 05/30 0700 In: 410 [P.O.:360; IV Piggyback:50] Out: -   Lab Results:  Recent Labs  08/31/13 1350 09/01/13 0540  WBC 14.4* 12.3*  HGB 13.6 12.5*  HCT 37.7* 34.8*  PLT 172 125*   BMET  Recent Labs  08/31/13 1350 09/01/13 0540  NA 128* 131*  K 3.6* 3.2*  CL 87* 91*  CO2 24 25  GLUCOSE 129* 43*  BUN 50* 50*  CREATININE 2.46* 2.08*  CALCIUM 8.3* 8.4   LFT  Recent Labs  08/31/13 1350  PROT 6.3  ALBUMIN 2.1*  AST 27  ALT 13  ALKPHOS 118*  BILITOT 5.8*   PT/INR  Recent Labs  09/01/13 0540  LABPROT 19.1*  INR 1.66*   US Paracentesis  08/31/2013   CLINICAL DATA:  Cirrhosis, ascites, abdominal pain. Request is made for diagnostic/ therapeutic paracentesis  EXAM: ULTRASOUND GUIDED DIAGNOSTIC AND THERAPEUTIC PARACENTESIS  COMPARISON:  None.  PROCEDURE: An ultrasound guided paracentesis was thoroughly discussed with the patient and questions answered. The benefits, risks, alternatives and complications were also discussed. The patient  understands and wishes to proceed with the procedure. Written consent was obtained.  Ultrasound was performed to localize and mark an adequate pocket of fluid in the left lower quadrant of the abdomen. The area was then prepped and draped in the normal sterile fashion. 1% Lidocaine was used for local anesthesia. Under ultrasound guidance a 19 gauge Yueh catheter was introduced. Paracentesis was performed. The catheter was removed and a dressing applied.  Complications: None.  FINDINGS: A total of approximately 5.6 liters of turbid, yellow fluid was removed. A fluid sample was sent for laboratory analysis.  IMPRESSION: Successful ultrasound guided diagnostic and therapeutic paracentesis yielding 5.6 liters of ascites.  Read by: Jeananne Rama ,P.A.-C.   Electronically Signed   By: Irish Lack M.D.   On: 08/31/2013 15:03    Assessment / Plan: 1.  SBP with new development of ascites:  Paracentesis with 5.6 Liters of turbid fluid removed on 5/29.  Received IV albumin yesterday and is on IV cefotaxime.  Will need another 1gm/kg of albumin on 5/31. 2.  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 Patient is status post EGD with banding of varices for bleeding December 2013.  Is on nadolol 40 mg daily. 3.  Hx of CAD / CABG 1993. Last cardiac cath was August 2012, see report for details.  4.  Hx of chronic lung disease, ? Asthma  5.  Diabetes, on insulin 6.  Hyponatremia/hypokalemia:  Likely at least in part secondary to his liver disease.  Repletion per primary service.    LOS: 1 day   Princella PellegriniJessica D. Zehr  09/01/2013, 8:35 AM  Pager number [phone]  GI ATTENDING  History, laboratories, x-rays reviewed. Patient personally seen and examined. Agree with H&P as above. He has SBP suspected. On appropriate therapy. Still feels lousy, though better. Electrolytes improved. Continue current medical regimen. He  will receive IV albumin tomorrow. We'll follow  Wilhemina BonitoJohn N. Eda KeysPerry, Jr., M.D. [facility] Division of Gastroenterology

## 2013-09-01 NOTE — Progress Notes (Signed)
[facility] PROGRESS NOTE   Zenda AlpersDaniel D Tecson WUJ:[id number] DOB: February 21, 1932 DOA: 08/31/2013 PCP: Dorrene GermanAVBUERE,EDWIN A, MD  HPI/Subjective: Feels much better, denies fever. Still has diffuse abdominal pain and tenderness.  Assessment/Plan: Principal Problem:   Abdominal pain Active Problems:   DIABETES, TYPE 2   Cirrhosis of liver   Ascites   Generalized weakness   SBP -Diffuse abdominal pain, started in periumbilical area.  -Paracentesis done yesterday, consistent with SBP with WBC of 4920 and 87% are neutrophils, -Patient started on cefotaxime yesterday, gastroenterology following. -Patient is on heart healthy diet, no nausea or vomiting.  Ascites  -Secondary to cirrhosis of the liver, patient not on diuretics.  -Hold on starting diuresis as patient has acute renal failure.  -Has +1 pedal edema.   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  -Patient is on beta blockers, he is AST and ALT within normal limits.  -Hyponatremia and hypoalbuminemia secondary to cirrhosis.   Diabetes mellitus type 2  -Patient is on 70/30 insulin mix, decrease the dose from 60 to 40 while is in the hospital.  -Check hemoglobin A1c, start carb modified diet after the paracentesis.  -Developed hypoglycemia with CBG of 43 this morning, likely secondary to poor oral intake, hold long-acting insulin.  Acute renal failure  -Last records of his creatinine was in August of 2014 which was 1.3. Presented with creatinine of 2.46. -Creatinine already improved to 2.0. -Follow creatinine closely, if not going down or worsening patient will need nephrology consultation.  Hypokalemia -Replete with oral supplements.  Code Status: Full code Family Communication: Plan discussed with the patient. Disposition Plan: Remains inpatient   Consultants:  Gastroenterology  Procedures:  Paracentesis with removal 5.6 L of ascitic  fluid  Antibiotics:  Cefotaxime   Objective: Filed Vitals:   09/01/13 0636  BP: 112/57  Pulse: 71  Temp: 98.6 F (37 C)  Resp: 14    Intake/Output Summary (Last 24 hours) at 09/01/13 1130 Last data filed at 09/01/13 0900  Gross per 24 hour  Intake    650 ml  Output    200 ml  Net    450 ml   Filed Weights   08/31/13 1249  Weight: 76.4 kg (168 lb 6.9 oz)    Exam: General: Alert and awake, oriented x3, not in any acute distress. HEENT: anicteric sclera, pupils reactive to light and accommodation, EOMI CVS: S1-S2 clear, no murmur rubs or gallops Chest: clear to auscultation bilaterally, no wheezing, rales or rhonchi Abdomen: soft nontender, nondistended, normal bowel sounds, no organomegaly Extremities: no cyanosis, clubbing or edema noted bilaterally Neuro: Cranial nerves II-XII intact, no focal neurological deficits  Data Reviewed: Basic Metabolic Panel:  Recent Labs Lab 08/31/13 1350 09/01/13 0540  NA 128* 131*  K 3.6* 3.2*  CL 87* 91*  CO2 24 25  GLUCOSE 129* 43*  BUN 50* 50*  CREATININE 2.46* 2.08*  CALCIUM 8.3* 8.4   Liver Function Tests:  Recent Labs Lab 08/31/13 1350  AST 27  ALT 13  ALKPHOS 118*  BILITOT 5.8*  PROT 6.3  ALBUMIN 2.1*   No results found for this basename: LIPASE, AMYLASE,  in the last 168 hours No results found for this basename: AMMONIA,  in the last 168 hours CBC:  Recent Labs Lab 08/31/13 1350 09/01/13 0540  WBC 14.4* 12.3*  HGB 13.6 12.5*  HCT 37.7* 34.8*  MCV 91.5 92.3  PLT 172 125*   Cardiac Enzymes: No results found for this basename: CKTOTAL,  CKMB, CKMBINDEX, TROPONINI,  in the last 168 hours BNP (last 3 results) No results found for this basename: PROBNP,  in the last 8760 hours CBG:  Recent Labs Lab 08/31/13 1304 08/31/13 1848 08/31/13 2127 09/01/13 0756  GLUCAP 118* 104* 73 73    Micro No results found for this or any previous visit (from the past 240 hour(s)).   Studies: US  Paracentesis  08/31/2013   CLINICAL DATA:  Cirrhosis, ascites, abdominal pain. Request is made for diagnostic/ therapeutic paracentesis  EXAM: ULTRASOUND GUIDED DIAGNOSTIC AND THERAPEUTIC PARACENTESIS  COMPARISON:  None.  PROCEDURE: An ultrasound guided paracentesis was thoroughly discussed with the patient and questions answered. The benefits, risks, alternatives and complications were also discussed. The patient understands and wishes to proceed with the procedure. Written consent was obtained.  Ultrasound was performed to localize and mark an adequate pocket of fluid in the left lower quadrant of the abdomen. The area was then prepped and draped in the normal sterile fashion. 1% Lidocaine was used for local anesthesia. Under ultrasound guidance a 19 gauge Yueh catheter was introduced. Paracentesis was performed. The catheter was removed and a dressing applied.  Complications: None.  FINDINGS: A total of approximately 5.6 liters of turbid, yellow fluid was removed. A fluid sample was sent for laboratory analysis.  IMPRESSION: Successful ultrasound guided diagnostic and therapeutic paracentesis yielding 5.6 liters of ascites.  Read by: Jeananne Rama ,P.A.-C.   Electronically Signed   By: Irish Lack M.D.   On: 08/31/2013 15:03    Scheduled Meds: . cefoTAXime (CLAFORAN) IV  2 g Intravenous 3 times per day  . heparin  5,000 Units Subcutaneous 3 times per day  . insulin aspart  0-9 Units Subcutaneous TID WC  . nadolol  40 mg Oral QAC breakfast  . pantoprazole  40 mg Oral BID  . potassium chloride  40 mEq Oral Q6H   Continuous Infusions:      Time spent: 35 minutes    Taiylor Virden [facility]  [facility] Pager [phone] If 7PM-7AM, please contact night-coverage at [email or web address], password [facility] 09/01/2013, 11:30 AM  LOS: 1 day

## 2013-09-01 NOTE — Progress Notes (Signed)
CBG 1142=42 Pt alert, oriented, appropriate speech. Skin warm and dry. 8oz orange juice given plus 4 oz icecream per pt request. CBG 1159=27 glucagon injection given. Pt continues to be alert & verbal. Speech slowed. No IV site at that time-pulled out by patient accidentally. [id number] = 26. Oral glucose gel given. IV team attempting IV start.. [id number] = 33. IV accesssed by IV team RN. 1/2 amp D50 given iv. CBG 1300 = 72. Dr Arthor Captain aware and on unit. Will continue to monitor. Pt remained alert with slight slurred speech and  Some short term memory during hypoglycemic epidode.Alex Wise

## 2013-09-02 DIAGNOSIS — E871 Hypo-osmolality and hyponatremia: Secondary | ICD-10-CM

## 2013-09-02 DIAGNOSIS — J449 Chronic obstructive pulmonary disease, unspecified: Secondary | ICD-10-CM

## 2013-09-02 LAB — GLUCOSE, CAPILLARY
GLUCOSE-CAPILLARY: 146 mg/dL — AB (ref 70–99)
GLUCOSE-CAPILLARY: 148 mg/dL — AB (ref 70–99)
GLUCOSE-CAPILLARY: 149 mg/dL — AB (ref 70–99)
GLUCOSE-CAPILLARY: 153 mg/dL — AB (ref 70–99)
Glucose-Capillary: 101 mg/dL — ABNORMAL HIGH (ref 70–99)
Glucose-Capillary: 106 mg/dL — ABNORMAL HIGH (ref 70–99)
Glucose-Capillary: 134 mg/dL — ABNORMAL HIGH (ref 70–99)
Glucose-Capillary: 150 mg/dL — ABNORMAL HIGH (ref 70–99)
Glucose-Capillary: 183 mg/dL — ABNORMAL HIGH (ref 70–99)

## 2013-09-02 LAB — COMPREHENSIVE METABOLIC PANEL
ALT: 13 U/L (ref 0–53)
AST: 37 U/L (ref 0–37)
Albumin: 2.5 g/dL — ABNORMAL LOW (ref 3.5–5.2)
Alkaline Phosphatase: 114 U/L (ref 39–117)
BILIRUBIN TOTAL: 3.3 mg/dL — AB (ref 0.3–1.2)
BUN: 45 mg/dL — AB (ref 6–23)
CHLORIDE: 92 meq/L — AB (ref 96–112)
CO2: 21 meq/L (ref 19–32)
CREATININE: 1.64 mg/dL — AB (ref 0.50–1.35)
Calcium: 8.2 mg/dL — ABNORMAL LOW (ref 8.4–10.5)
GFR, EST AFRICAN AMERICAN: 44 mL/min — AB (ref >90)
GFR, EST NON AFRICAN AMERICAN: 38 mL/min — AB (ref >90)
Glucose, Bld: 151 mg/dL — ABNORMAL HIGH (ref 70–99)
Potassium: 4 mEq/L (ref 3.7–5.3)
Sodium: 130 mEq/L — ABNORMAL LOW (ref 137–147)
Total Protein: 5.8 g/dL — ABNORMAL LOW (ref 6.0–8.3)

## 2013-09-02 LAB — CBC
HCT: 38.8 % — ABNORMAL LOW (ref 39.0–52.0)
Hemoglobin: 13.9 g/dL (ref 13.0–17.0)
MCH: 33 pg (ref 26.0–34.0)
MCHC: 35.8 g/dL (ref 30.0–36.0)
MCV: 92.2 fL (ref 78.0–100.0)
PLATELETS: 82 10*3/uL — AB (ref 150–400)
RBC: 4.21 MIL/uL — ABNORMAL LOW (ref 4.22–5.81)
RDW: 15.7 % — ABNORMAL HIGH (ref 11.5–15.5)
WBC: 10.4 10*3/uL (ref 4.0–10.5)

## 2013-09-02 LAB — PROTIME-INR
INR: 1.45 (ref 0.00–1.49)
PROTHROMBIN TIME: 17.3 s — AB (ref 11.6–15.2)

## 2013-09-02 MED ORDER — ALBUMIN HUMAN 25 % IV SOLN
75.0000 g | Freq: Once | INTRAVENOUS | Status: AC
  Administered 2013-09-02: 75 g via INTRAVENOUS
  Filled 2013-09-02: qty 300

## 2013-09-02 MED ORDER — FUROSEMIDE 20 MG PO TABS
20.0000 mg | ORAL_TABLET | Freq: Every day | ORAL | Status: DC
  Administered 2013-09-02 – 2013-09-06 (×5): 20 mg via ORAL
  Filled 2013-09-02 (×5): qty 1

## 2013-09-02 MED ORDER — POLYETHYLENE GLYCOL 3350 17 G PO PACK
17.0000 g | PACK | Freq: Once | ORAL | Status: AC
  Administered 2013-09-02: 17 g via ORAL
  Filled 2013-09-02: qty 1

## 2013-09-02 MED ORDER — SPIRONOLACTONE 50 MG PO TABS
50.0000 mg | ORAL_TABLET | Freq: Every day | ORAL | Status: DC
  Administered 2013-09-02 – 2013-09-06 (×5): 50 mg via ORAL
  Filled 2013-09-02 (×5): qty 1

## 2013-09-02 NOTE — Progress Notes (Signed)
[facility] PROGRESS NOTE   Alex Wise:[id number] DOB: [birth year] DOA: 08/31/2013 PCP: Dorrene German, MD  HPI/Subjective: Feels much better still has diffuse abdominal tenderness but less than yesterday.  Assessment/Plan: Principal Problem:   Abdominal pain Active Problems:   DIABETES, TYPE 2   Cirrhosis of liver   Ascites   Generalized weakness   SBP -Diffuse abdominal pain, started in periumbilical area.  -Paracentesis done yesterday, consistent with SBP with WBC of 4920 and 87% are neutrophils, -Patient started on cefotaxime yesterday, gastroenterology following. -Patient is on heart healthy diet, no nausea or vomiting.  Hypoglycemia -Patient had episode of hypoglycemia with blood sugar went down to 29. Likely secondary to insulin. -Treated with portion of D50, was on D5W since yesterday, we'll discontinue his blood sugar > 150 today.  Ascites  -Secondary to cirrhosis of the liver, patient not on diuretics.  -Hold on starting diuresis as patient has acute renal failure.  -Has +1 pedal edema.   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  -Patient is on beta blockers, he is AST and ALT within normal limits.  -Hyponatremia, thrombocytopenia and hypoalbuminemia secondary to cirrhosis.   Diabetes mellitus type 2  -Patient is on 70/30 insulin mix, decrease the dose from 60 to 40 while is in the hospital.  -Check hemoglobin A1c, start carb modified diet after the paracentesis.  -Developed hypoglycemia with CBG of 43 this morning, likely secondary to poor oral intake, hold long-acting insulin.  Acute renal failure  -Last records of his creatinine was in August of 2014 which was 1.3. Presented with creatinine of 2.46. -Creatinine already improved to 1.6. -Follow creatinine closely, if not going down or worsening patient will need nephrology consultation.  Hypokalemia -Replete with oral supplements.  Code Status: Full  code Family Communication: Plan discussed with the patient. Disposition Plan: Remains inpatient   Consultants:  Gastroenterology  Procedures:  Paracentesis with removal 5.6 L of ascitic fluid  Antibiotics:  Cefotaxime   Objective: Filed Vitals:   09/02/13 0425  BP: 78/51  Pulse: 66  Temp: 97.6 F (36.4 C)  Resp: 14    Intake/Output Summary (Last 24 hours) at 09/02/13 1049 Last data filed at 09/02/13 0600  Gross per 24 hour  Intake 2046.67 ml  Output    775 ml  Net 1271.67 ml   Filed Weights   08/31/13 1249  Weight: 76.4 kg (168 lb 6.9 oz)    Exam: General: Alert and awake, oriented x3, not in any acute distress. HEENT: anicteric sclera, pupils reactive to light and accommodation, EOMI CVS: S1-S2 clear, no murmur rubs or gallops Chest: clear to auscultation bilaterally, no wheezing, rales or rhonchi Abdomen: soft nontender, nondistended, normal bowel sounds, no organomegaly Extremities: no cyanosis, clubbing or edema noted bilaterally Neuro: Cranial nerves II-XII intact, no focal neurological deficits  Data Reviewed: Basic Metabolic Panel:  Recent Labs Lab 08/31/13 1350 09/01/13 0540 09/01/13 1305 09/02/13 0510  NA 128* 131* 132* 130*  K 3.6* 3.2* 3.8 4.0  CL 87* 91* 90* 92*  CO2 24 25 24 21   GLUCOSE 129* 43* 62* 151*  BUN 50* 50* 48* 45*  CREATININE 2.46* 2.08* 1.92* 1.64*  CALCIUM 8.3* 8.4 8.6 8.2*   Liver Function Tests:  Recent Labs Lab 08/31/13 1350 09/02/13 0510  AST 27 37  ALT 13 13  ALKPHOS 118* 114  BILITOT 5.8* 3.3*  PROT 6.3 5.8*  ALBUMIN 2.1* 2.5*   No results found for this basename: LIPASE, AMYLASE,  in the last 168 hours No results found for this basename: AMMONIA,  in the last 168 hours CBC:  Recent Labs Lab 08/31/13 1350 09/01/13 0540 09/02/13 0510  WBC 14.4* 12.3* 10.4  HGB 13.6 12.5* 13.9  HCT 37.7* 34.8* 38.8*  MCV 91.5 92.3 92.2  PLT 172 125* 82*   Cardiac Enzymes: No results found for this basename:  CKTOTAL, CKMB, CKMBINDEX, TROPONINI,  in the last 168 hours BNP (last 3 results) No results found for this basename: PROBNP,  in the last 8760 hours CBG:  Recent Labs Lab 09/01/13 1817 09/01/13 2355 09/02/13 0355 09/02/13 0743 09/02/13 1028  GLUCAP 106* 101* 146* 148* 150*    Micro Recent Results (from the past 240 hour(s))  CULTURE, BLOOD (ROUTINE X 2)     Status: None   Collection Time    08/31/13  2:11 PM      Result Value Ref Range Status   Specimen Description BLOOD LEFT ARM   Final   Special Requests     Final   Value: BOTTLES DRAWN AEROBIC AND ANAEROBIC 10CC BOTH BOTTLES   Culture  Setup Time     Final   Value: 08/31/2013 21:41     Performed at [facility]   Culture     Final   Value: GRAM POSITIVE COCCI IN PAIRS     Note: Gram Stain Report Called to,Read Back By and Verified With: MELVINA MOYE 09/01/13 @ 8:27PM BY RUSCOE A.     Performed at [facility]   Report Status PENDING   Incomplete  BODY FLUID CULTURE     Status: None   Collection Time    08/31/13  3:03 PM      Result Value Ref Range Status   Specimen Description PERITONEAL   Final   Special Requests NONE   Final   Gram Stain     Final   Value: NO WBC SEEN     NO ORGANISMS SEEN     Performed at [facility]   Culture     Final   Value: NO GROWTH     Performed at [facility]   Report Status PENDING   Incomplete  CULTURE, BLOOD (ROUTINE X 2)     Status: None   Collection Time    08/31/13  3:40 PM      Result Value Ref Range Status   Specimen Description BLOOD LEFT ARM   Final   Special Requests BOTTLES DRAWN AEROBIC AND ANAEROBIC 5CC   Final   Culture  Setup Time     Final   Value: 08/31/2013 21:42     Performed at [facility]   Culture     Final   Value:        BLOOD CULTURE RECEIVED NO GROWTH TO DATE CULTURE WILL BE HELD FOR 5 DAYS BEFORE ISSUING A FINAL NEGATIVE REPORT     Performed at [facility]   Report Status PENDING   Incomplete      Studies: [place] Paracentesis  08/31/2013   CLINICAL DATA:  Cirrhosis, ascites, abdominal pain. Request is made for diagnostic/ therapeutic paracentesis  EXAM: ULTRASOUND GUIDED DIAGNOSTIC AND THERAPEUTIC PARACENTESIS  COMPARISON:  None.  PROCEDURE: An ultrasound guided paracentesis was thoroughly discussed with the patient and questions answered. The benefits, risks, alternatives and complications were also discussed. The patient understands and wishes to proceed with the procedure. Written consent was obtained.  Ultrasound was performed to localize and mark an adequate pocket  of fluid in the left lower quadrant of the abdomen. The area was then prepped and draped in the normal sterile fashion. 1% Lidocaine was used for local anesthesia. Under ultrasound guidance a 19 gauge Yueh catheter was introduced. Paracentesis was performed. The catheter was removed and a dressing applied.  Complications: None.  FINDINGS: A total of approximately 5.6 liters of turbid, yellow fluid was removed. A fluid sample was sent for laboratory analysis.  IMPRESSION: Successful ultrasound guided diagnostic and therapeutic paracentesis yielding 5.6 liters of ascites.  Read by: Jeananne Rama ,P.A.-C.   Electronically Signed   By: Irish Lack M.D.   On: 08/31/2013 15:03    Scheduled Meds: . albumin human  75 g Intravenous Once  . cefoTAXime (CLAFORAN) IV  2 g Intravenous 3 times per day  . furosemide  20 mg Oral Daily  . heparin  5,000 Units Subcutaneous 3 times per day  . insulin aspart  0-9 Units Subcutaneous TID WC  . nadolol  40 mg Oral QAC breakfast  . pantoprazole  40 mg Oral BID  . polyethylene glycol  17 g Oral Once  . spironolactone  50 mg Oral Daily   Continuous Infusions: . dextrose 50 mL/hr at 09/02/13 0015       Time spent: 35 minutes    Clydia Llano  [facility] Pager [phone] If 7PM-7AM, please contact night-coverage at [email or web address], password [facility] 09/02/2013, 10:49 AM  LOS: 2 days

## 2013-09-02 NOTE — Progress Notes (Signed)
[facility] Progress Note  Subjective:  Feeling better today.  Abdominal pain is not as bad.  Would like to receive something to help him move his bowels; no BM since before admission on Thursday.  I will order a dose of Miralax.  Objective:  Vital signs in last 24 hours: Temp:  [97.1 F (36.2 C)-98.3 F (36.8 C)] 97.6 F (36.4 C) (05/31 0425) Pulse Rate:  [66-68] 66 (05/31 0425) Resp:  [14-16] 14 (05/31 0425) BP: (75-118)/(51-71) 78/51 mmHg (05/31 0425) SpO2:  [96 %-100 %] 96 % (05/31 0425) Last BM Date: 08/31/13 General:  Alert, chronically ill-appearing, in NAD Heart:  Regular rate and rhythm; no murmurs Pulm:  CTAB.  No W/R/R. Abdomen:  Soft, less distended. Normal bowel sounds.  Mild-moderate diffuse TTP.  Some ascites fluid still present. Extremities:  Without edema. Neurologic:  Alert and  oriented x4;  grossly normal neurologically. Psych:  Alert and cooperative. Normal mood and affect.  Intake/Output from previous day: 05/30 0701 - 05/31 0700 In: 2526.7 [P.O.:1560; I.V.:866.7; IV Piggyback:100] Out: 975 [Urine:975] Intake/Output this shift:    Lab Results:  Recent Labs  08/31/13 1350 09/01/13 0540 09/02/13 0510  WBC 14.4* 12.3* 10.4  HGB 13.6 12.5* 13.9  HCT 37.7* 34.8* 38.8*  PLT 172 125* 82*   BMET  Recent Labs  09/01/13 0540 09/01/13 1305 09/02/13 0510  NA 131* 132* 130*  K 3.2* 3.8 4.0  CL 91* 90* 92*  CO2 25 24 21   GLUCOSE 43* 62* 151*  BUN 50* 48* 45*  CREATININE 2.08* 1.92* 1.64*  CALCIUM 8.4 8.6 8.2*   LFT  Recent Labs  09/02/13 0510  PROT 5.8*  ALBUMIN 2.5*  AST 37  ALT 13  ALKPHOS 114  BILITOT 3.3*   PT/INR  Recent Labs  09/01/13 0540 09/02/13 0510  LABPROT 19.1* 17.3*  INR 1.66* 1.45   US Paracentesis  08/31/2013   CLINICAL DATA:  Cirrhosis, ascites, abdominal pain. Request is made for diagnostic/ therapeutic paracentesis  EXAM: ULTRASOUND GUIDED DIAGNOSTIC AND THERAPEUTIC PARACENTESIS   COMPARISON:  None.  PROCEDURE: An ultrasound guided paracentesis was thoroughly discussed with the patient and questions answered. The benefits, risks, alternatives and complications were also discussed. The patient understands and wishes to proceed with the procedure. Written consent was obtained.  Ultrasound was performed to localize and mark an adequate pocket of fluid in the left lower quadrant of the abdomen. The area was then prepped and draped in the normal sterile fashion. 1% Lidocaine was used for local anesthesia. Under ultrasound guidance a 19 gauge Yueh catheter was introduced. Paracentesis was performed. The catheter was removed and a dressing applied.  Complications: None.  FINDINGS: A total of approximately 5.6 liters of turbid, yellow fluid was removed. A fluid sample was sent for laboratory analysis.  IMPRESSION: Successful ultrasound guided diagnostic and therapeutic paracentesis yielding 5.6 liters of ascites.  Read by: Jeananne Rama ,P.A.-C.   Electronically Signed   By: Irish Lack M.D.   On: 08/31/2013 15:03    Assessment / Plan: 1. SBP with new development of ascites: Paracentesis with 5.6 Liters of turbid fluid removed on 5/29. Received IV albumin on 5/29 and is on IV cefotaxime. Will give another 1gm/kg (75 grams) of albumin of 25% today.  Will start low dose diuretics with lasix 20 mg PO daily and spironolactone 50 mg PO daily.  Recheck CBC and BMP in AM.  Needs close monitoring of renal function and lytes.  2. Cryptogenic cirrhosis complicated  by portal hypertension, thrombocytopenia, esophageal varices, coagulopathy, and now the ascites/SBP as listed above. Patient is status post EGD with banding of varices for bleeding December 2013. Is on nadolol 40 mg daily.  3. Hx of CAD / CABG 1993. Last cardiac cath was August 2012, see report for details.  4. Hx of chronic lung disease, ? Asthma  5. Diabetes, on insulin  6. Hyponatremia: Likely at least in part secondary to his liver  disease. Repletion per primary service.     LOS: 2 days   Princella PellegriniJessica D. Zehr  09/02/2013, 8:57 AM  Pager number [phone]   GI ATTENDING  Interval history and data reviewed. Patient personally seen and examined. Agree with H&P as above. Doing better today with less abdominal discomfort. Still with electrolyte abnormalities. We'll start low dose diuretics. He will receive IV albumin today. Monitor electrolytes and renal function closely.  Wilhemina BonitoJohn N. Eda KeysPerry, Jr., M.D. [facility] Division of Gastroenterology

## 2013-09-03 DIAGNOSIS — D696 Thrombocytopenia, unspecified: Secondary | ICD-10-CM

## 2013-09-03 LAB — CULTURE, BLOOD (ROUTINE X 2)

## 2013-09-03 LAB — BASIC METABOLIC PANEL
BUN: 43 mg/dL — ABNORMAL HIGH (ref 6–23)
CALCIUM: 8.4 mg/dL (ref 8.4–10.5)
CO2: 23 mEq/L (ref 19–32)
Chloride: 93 mEq/L — ABNORMAL LOW (ref 96–112)
Creatinine, Ser: 1.53 mg/dL — ABNORMAL HIGH (ref 0.50–1.35)
GFR calc Af Amer: 48 mL/min — ABNORMAL LOW (ref >90)
GFR, EST NON AFRICAN AMERICAN: 42 mL/min — AB (ref >90)
GLUCOSE: 127 mg/dL — AB (ref 70–99)
Potassium: 3.4 mEq/L — ABNORMAL LOW (ref 3.7–5.3)
SODIUM: 131 meq/L — AB (ref 137–147)

## 2013-09-03 LAB — GLUCOSE, CAPILLARY
GLUCOSE-CAPILLARY: 127 mg/dL — AB (ref 70–99)
GLUCOSE-CAPILLARY: 127 mg/dL — AB (ref 70–99)
GLUCOSE-CAPILLARY: 145 mg/dL — AB (ref 70–99)
Glucose-Capillary: 125 mg/dL — ABNORMAL HIGH (ref 70–99)
Glucose-Capillary: 213 mg/dL — ABNORMAL HIGH (ref 70–99)
Glucose-Capillary: 138 mg/dL — ABNORMAL HIGH (ref 70–99)

## 2013-09-03 LAB — CBC
HCT: 34.6 % — ABNORMAL LOW (ref 39.0–52.0)
HEMOGLOBIN: 12.1 g/dL — AB (ref 13.0–17.0)
MCH: 32.5 pg (ref 26.0–34.0)
MCHC: 35 g/dL (ref 30.0–36.0)
MCV: 93 fL (ref 78.0–100.0)
Platelets: 71 10*3/uL — ABNORMAL LOW (ref 150–400)
RBC: 3.72 MIL/uL — ABNORMAL LOW (ref 4.22–5.81)
RDW: 15.8 % — ABNORMAL HIGH (ref 11.5–15.5)
WBC: 7.5 10*3/uL (ref 4.0–10.5)

## 2013-09-03 LAB — AMMONIA: Ammonia: 16 umol/L (ref 11–60)

## 2013-09-03 MED ORDER — POTASSIUM CHLORIDE CRYS ER 20 MEQ PO TBCR
40.0000 meq | EXTENDED_RELEASE_TABLET | Freq: Once | ORAL | Status: AC
  Administered 2013-09-03: 40 meq via ORAL
  Filled 2013-09-03: qty 2

## 2013-09-03 MED ORDER — SODIUM CHLORIDE 0.9 % IV SOLN: INTRAVENOUS | Status: DC

## 2013-09-03 MED ORDER — LACTULOSE 10 GM/15ML PO SOLN
20.0000 g | Freq: Every day | ORAL | Status: DC
  Administered 2013-09-04 – 2013-09-06 (×3): 20 g via ORAL
  Filled 2013-09-03 (×3): qty 30

## 2013-09-03 NOTE — Progress Notes (Signed)
     [facility] Progress Note  Subjective:  Says abdominal pain is getting a little better each day; still hurts when he coughs.  Mentation seems a little slower today.  Objective:  Vital signs in last 24 hours: Temp:  [97.5 F (36.4 C)-98.2 F (36.8 C)] 98 F (36.7 C) (06/01 0559) Pulse Rate:  [71-72] 72 (06/01 0559) Resp:  [18-20] 20 (06/01 0559) BP: (94-100)/(56-65) 100/58 mmHg (06/01 0559) SpO2:  [95 %-100 %] 95 % (06/01 0559) Last BM Date: 08/31/13 General:  Alert, chronically ill-appearing, in NAD Heart:  Regular rate and rhythm; no murmurs Pulm:  Mild crackles at lung bases. Abdomen:  Softly distended with ascites fluid. Normal bowel sounds.  Mild diffuse TTP. Extremities:  Without edema. Neurologic:  Alert and oriented x4; grossly normal neurologically. Psych:  Alert and cooperative. Normal mood and affect.  Intake/Output from previous day: 05/31 0701 - 06/01 0700 In: 940 [P.O.:840; IV Piggyback:100] Out: 700 [Urine:700]  Lab Results:  Recent Labs  09/01/13 0540 09/02/13 0510 09/03/13 0432  WBC 12.3* 10.4 7.5  HGB 12.5* 13.9 12.1*  HCT 34.8* 38.8* 34.6*  PLT 125* 82* 71*   BMET  Recent Labs  09/01/13 1305 09/02/13 0510 09/03/13 0432  NA 132* 130* 131*  K 3.8 4.0 3.4*  CL 90* 92* 93*  CO2 24 21 23   GLUCOSE 62* 151* 127*  BUN 48* 45* 43*  CREATININE 1.92* 1.64* 1.53*  CALCIUM 8.6 8.2* 8.4   LFT  Recent Labs  09/02/13 0510  PROT 5.8*  ALBUMIN 2.5*  AST 37  ALT 13  ALKPHOS 114  BILITOT 3.3*   PT/INR  Recent Labs  09/01/13 0540 09/02/13 0510  LABPROT 19.1* 17.3*  INR 1.66* 1.45   Assessment / Plan: 1. SBP with new development of ascites: Paracentesis with 5.6 Liters of turbid fluid removed on 5/29. Received IV albumin on 5/29 and 5/31.  Is on IV cefotaxime since 5/29 as well.  Started low dose diuretics with lasix 20 mg PO daily and spironolactone 50 mg PO daily on 5/31.  Needs close monitoring of renal function and  lytes.  ? Repeat paracentesis prior to discharge.  2. 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 Patient is status post EGD with banding of varices for bleeding December 2013. Is on nadolol 40 mg daily.  3. Hx of CAD / CABG 1993. Last cardiac cath was August 2012, see report for details.  4. Hx of chronic lung disease, ? Asthma  5. Diabetes, on insulin  6. Hyponatremia/hypokalemia: Likely at least in part secondary to his liver disease. Repletion per primary service.    LOS: 3 days   Princella Pellegrini. Zehr  09/03/2013, 8:08 AM  Pager number [phone]  Attending MD note:   I have taken a history, examined the patient, and reviewed the chart. I agree with the Advanced Practitioner's impression and recommendations. Ascites has re accumulated. Clinically doing well. Consider  Repeat paracentesis about Wed 09/05/2013 approx 3000cc, creatinine is down to 1.5  [facility] Pager # [phone]

## 2013-09-03 NOTE — Evaluation (Signed)
Physical Therapy Evaluation Patient Details Name: Alex Wise MRN: [id number] DOB: 27-May-1931 Today's Date: 09/03/2013   History of Present Illness  80 yo male admitted with abd pain, ascites. hx of liver cirrhosis, HTN, IDDM, asthma, CVA. Pt lives alone  Clinical Impression  On eval, pt required Min assist+2 for mobility-able to perform stand pivot with [facility], bed to recliner. Demonstrates general weakness, decreased activity tolerance,and impaired gait and balance.    Follow Up Recommendations Home health PT;SNF;Supervision for mobility/OOB (depending on progress)    Equipment Recommendations   (to be determined)    Recommendations for Other Services OT consult     Precautions / Restrictions Precautions Precautions: Fall Restrictions Weight Bearing Restrictions: No      Mobility  Bed Mobility Overal bed mobility: Needs Assistance Bed Mobility: Supine to Sit     Supine to sit: Mod assist;HOB elevated     General bed mobility comments: assist for trunk to upright. Increased time.   Transfers Overall transfer level: Needs assistance Equipment used: 2 person hand held assist Transfers: Sit to/from [employer];Stand Pivot Transfers Sit to Stand: Min assist;+2 physical assistance;+2 safety/equipment Stand pivot transfers: Min assist;+2 physical assistance;+2 safety/equipment       General transfer comment: assist to rise, stabilize, control descent.   Ambulation/Gait             General Gait Details: Deferred due to pt's lunch tray arrived.   Stairs            Wheelchair Mobility    Modified Rankin (Stroke Patients Only)       Balance Overall balance assessment: Needs assistance         Standing balance support: Bilateral upper extremity supported;During functional activity Standing balance-Leahy Scale: Poor                               Pertinent Vitals/Pain abd pain-unrated    Home Living Family/patient expects to be  discharged to:: Private residence Living Arrangements: Alone   Type of Home: House Home Access: Stairs to enter   Secretary/administratorntrance Stairs-Number of Steps: 4 Home Layout: One level Home Equipment: Cane - single point      Prior Function Level of Independence: Independent with assistive device(s)         Comments: uses cane intermittently     Hand Dominance        Extremity/Trunk Assessment   Upper Extremity Assessment: Overall WFL for tasks assessed           Lower Extremity Assessment: Overall WFL for tasks assessed      Cervical / Trunk Assessment: Kyphotic  Communication   Communication: No difficulties  Cognition Arousal/Alertness: Awake/alert Behavior During Therapy: WFL for tasks assessed/performed Overall Cognitive Status: Within Functional Limits for tasks assessed                      General Comments      Exercises        Assessment/Plan    PT Assessment Patient needs continued PT services  PT Diagnosis Difficulty walking;Abnormality of gait;Generalized weakness;Acute pain   PT Problem List Decreased strength;Decreased activity tolerance;Decreased balance;Decreased mobility;Pain;Decreased knowledge of use of DME  PT Treatment Interventions DME instruction;Gait training;Functional mobility training;Therapeutic activities;Therapeutic exercise;Patient/family education;Balance training   PT Goals (Current goals can be found in the Care Plan section) Acute Rehab PT Goals Patient Stated Goal: to get better PT Goal Formulation: With patient Time For  Goal Achievement: 09/17/13 Potential to Achieve Goals: Good    Frequency Min 3X/week   Barriers to discharge        Co-evaluation               End of Session   Activity Tolerance: Patient limited by fatigue Patient left: in chair;with call bell/phone within reach           Time: 1358-1406 PT Time Calculation (min): 8 min   Charges:   PT Evaluation $Initial PT Evaluation Tier  I: 1 Procedure PT Treatments $Therapeutic Activity: 8-22 mins   PT G Codes:          Rebeca Alert, MPT Pager: [phone]

## 2013-09-03 NOTE — Progress Notes (Signed)
[facility] PROGRESS NOTE   NA TROWER MNO:[id number] DOB: 1932-01-15 DOA: 08/31/2013 PCP: Dorrene German, MD  HPI/Subjective: Feels much better, abdominal pain is improving as well as the tenderness. Patient is constipated, check ammonia level and start on lactulose for constipation.  Assessment/Plan: Principal Problem:   Abdominal pain Active Problems:   DIABETES, TYPE 2   Cirrhosis of liver   Ascites   Generalized weakness   SBP -Diffuse abdominal pain, started in periumbilical area.  -Paracentesis done yesterday, consistent with SBP with WBC of 4920 and 87% are neutrophils, -Patient started on cefotaxime yesterday, gastroenterology following. -Patient is on heart healthy diet, no nausea or vomiting.  Bacteremia -1/2 of blood cultures showing Streptococcus viridans. -Likely to be contaminant, repeat culture today.  Hypoglycemia -Patient had episode of hypoglycemia with blood sugar went down to 29. Likely secondary to insulin. -Treated with portion of D50, was on D5W since yesterday, we'll discontinue his blood sugar > 150 today. -Blood sugars around 120-180 without the insulin  Ascites  -Secondary to cirrhosis of the liver, patient not on diuretics.  -Hold on starting diuresis as patient has acute renal failure.  -Has +1 pedal edema.   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  -Patient is on beta blockers, he is AST and ALT within normal limits.  -Hyponatremia, thrombocytopenia and hypoalbuminemia secondary to cirrhosis.   Diabetes mellitus type 2  -Patient is on 70/30 insulin mix, decrease the dose from 60 to 40 while is in the hospital.  -Check hemoglobin A1c, start carb modified diet after the paracentesis.  -Developed hypoglycemia with CBG of 43 this morning, likely secondary to poor oral intake, hold long-acting insulin.  Acute renal failure  -Last records of his creatinine was in August of 2014 which was 1.3.  Presented with creatinine of 2.46. -Creatinine already improved to 1.6. -Follow creatinine closely, if not going down or worsening patient will need nephrology consultation.  Hypokalemia -Replete with oral supplements.  Code Status: Full code Family Communication: Plan discussed with the patient. Disposition Plan: Remains inpatient   Consultants:  Gastroenterology  Procedures:  Paracentesis with removal 5.6 L of ascitic fluid  Antibiotics:  Cefotaxime   Objective: Filed Vitals:   09/03/13 0559  BP: 100/58  Pulse: 72  Temp: 98 F (36.7 C)  Resp: 20    Intake/Output Summary (Last 24 hours) at 09/03/13 1016 Last data filed at 09/03/13 0541  Gross per 24 hour  Intake    700 ml  Output    600 ml  Net    100 ml   Filed Weights   08/31/13 1249  Weight: 76.4 kg (168 lb 6.9 oz)    Exam: General: Alert and awake, oriented x3, not in any acute distress. HEENT: anicteric sclera, pupils reactive to light and accommodation, EOMI CVS: S1-S2 clear, no murmur rubs or gallops Chest: clear to auscultation bilaterally, no wheezing, rales or rhonchi Abdomen: soft nontender, nondistended, normal bowel sounds, no organomegaly Extremities: no cyanosis, clubbing or edema noted bilaterally Neuro: Cranial nerves II-XII intact, no focal neurological deficits  Data Reviewed: Basic Metabolic Panel:  Recent Labs Lab 08/31/13 1350 09/01/13 0540 09/01/13 1305 09/02/13 0510 09/03/13 0432  NA 128* 131* 132* 130* 131*  K 3.6* 3.2* 3.8 4.0 3.4*  CL 87* 91* 90* 92* 93*  CO2 24 25 24 21 23   GLUCOSE 129* 43* 62* 151* 127*  BUN 50* 50* 48* 45* 43*  CREATININE 2.46* 2.08* 1.92* 1.64* 1.53*  CALCIUM 8.3*  8.4 8.6 8.2* 8.4   Liver Function Tests:  Recent Labs Lab 08/31/13 1350 09/02/13 0510  AST 27 37  ALT 13 13  ALKPHOS 118* 114  BILITOT 5.8* 3.3*  PROT 6.3 5.8*  ALBUMIN 2.1* 2.5*   No results found for this basename: LIPASE, AMYLASE,  in the last 168 hours No results  found for this basename: AMMONIA,  in the last 168 hours CBC:  Recent Labs Lab 08/31/13 1350 09/01/13 0540 09/02/13 0510 09/03/13 0432  WBC 14.4* 12.3* 10.4 7.5  HGB 13.6 12.5* 13.9 12.1*  HCT 37.7* 34.8* 38.8* 34.6*  MCV 91.5 92.3 92.2 93.0  PLT 172 125* 82* 71*   Cardiac Enzymes: No results found for this basename: CKTOTAL, CKMB, CKMBINDEX, TROPONINI,  in the last 168 hours BNP (last 3 results) No results found for this basename: PROBNP,  in the last 8760 hours CBG:  Recent Labs Lab 09/02/13 1622 09/02/13 2058 09/03/13 0014 09/03/13 0429 09/03/13 0801  GLUCAP 183* 153* 127* 127* 125*    Micro Recent Results (from the past 240 hour(s))  CULTURE, BLOOD (ROUTINE X 2)     Status: None   Collection Time    08/31/13  2:11 PM      Result Value Ref Range Status   Specimen Description BLOOD LEFT ARM   Final   Special Requests     Final   Value: BOTTLES DRAWN AEROBIC AND ANAEROBIC 10CC BOTH BOTTLES   Culture  Setup Time     Final   Value: 08/31/2013 21:41     Performed at [facility]   Culture     Final   Value: VIRIDANS STREPTOCOCCUS     Note: THE SIGNIFICANCE OF ISOLATING THIS ORGANISM FROM A SINGLE SET OF BLOOD CULTURES WHEN MULTIPLE SETS ARE DRAWN IS UNCERTAIN. PLEASE NOTIFY THE MICROBIOLOGY DEPARTMENT WITHIN ONE WEEK IF SPECIATION AND SENSITIVITIES ARE REQUIRED.     Note: Gram Stain Report Called to,Read Back By and Verified With: MELVINA MOYE 09/01/13 @ 8:27PM BY RUSCOE A.     Performed at [facility]   Report Status 09/03/2013 FINAL   Final  BODY FLUID CULTURE     Status: None   Collection Time    08/31/13  3:03 PM      Result Value Ref Range Status   Specimen Description PERITONEAL   Final   Special Requests NONE   Final   Gram Stain     Final   Value: NO WBC SEEN     NO ORGANISMS SEEN     Performed at [facility]   Culture     Final   Value: NO GROWTH 2 DAYS     Performed at [facility]   Report Status PENDING    Incomplete  CULTURE, BLOOD (ROUTINE X 2)     Status: None   Collection Time    08/31/13  3:40 PM      Result Value Ref Range Status   Specimen Description BLOOD LEFT ARM   Final   Special Requests BOTTLES DRAWN AEROBIC AND ANAEROBIC 5CC   Final   Culture  Setup Time     Final   Value: 08/31/2013 21:42     Performed at [facility]   Culture     Final   Value:        BLOOD CULTURE RECEIVED NO GROWTH TO DATE CULTURE WILL BE HELD FOR 5 DAYS BEFORE ISSUING A FINAL NEGATIVE REPORT     [facility]   Report Status PENDING   Incomplete     Studies: No results found.  Scheduled Meds: . cefoTAXime (CLAFORAN) IV  2 g Intravenous 3 times per day  . furosemide  20 mg Oral Daily  . heparin  5,000 Units Subcutaneous 3 times per day  . insulin aspart  0-9 Units Subcutaneous TID WC  . nadolol  40 mg Oral QAC breakfast  . pantoprazole  40 mg Oral BID  . potassium chloride  40 mEq Oral Once  . spironolactone  50 mg Oral Daily   Continuous Infusions: . sodium chloride         Time spent: 35 minutes    Clydia LlanoMutaz Kyrian Stage  [facility] Pager [phone] If 7PM-7AM, please contact night-coverage at [email or web address], password [facility] 09/03/2013, 10:16 AM  LOS: 3 days

## 2013-09-04 DIAGNOSIS — J45909 Unspecified asthma, uncomplicated: Secondary | ICD-10-CM

## 2013-09-04 LAB — GLUCOSE, CAPILLARY
GLUCOSE-CAPILLARY: 237 mg/dL — AB (ref 70–99)
Glucose-Capillary: 140 mg/dL — ABNORMAL HIGH (ref 70–99)
Glucose-Capillary: 160 mg/dL — ABNORMAL HIGH (ref 70–99)
Glucose-Capillary: 163 mg/dL — ABNORMAL HIGH (ref 70–99)
Glucose-Capillary: 182 mg/dL — ABNORMAL HIGH (ref 70–99)
Glucose-Capillary: 237 mg/dL — ABNORMAL HIGH (ref 70–99)

## 2013-09-04 LAB — BASIC METABOLIC PANEL
BUN: 40 mg/dL — ABNORMAL HIGH (ref 6–23)
CALCIUM: 8.5 mg/dL (ref 8.4–10.5)
CO2: 24 meq/L (ref 19–32)
Chloride: 92 mEq/L — ABNORMAL LOW (ref 96–112)
Creatinine, Ser: 1.48 mg/dL — ABNORMAL HIGH (ref 0.50–1.35)
GFR calc Af Amer: 50 mL/min — ABNORMAL LOW (ref >90)
GFR calc non Af Amer: 43 mL/min — ABNORMAL LOW (ref >90)
Glucose, Bld: 166 mg/dL — ABNORMAL HIGH (ref 70–99)
Potassium: 3.7 mEq/L (ref 3.7–5.3)
SODIUM: 132 meq/L — AB (ref 137–147)

## 2013-09-04 LAB — BODY FLUID CULTURE
CULTURE: NO GROWTH
Gram Stain: NONE SEEN

## 2013-09-04 LAB — CBC
HCT: 35.5 % — ABNORMAL LOW (ref 39.0–52.0)
Hemoglobin: 12.2 g/dL — ABNORMAL LOW (ref 13.0–17.0)
MCH: 32.4 pg (ref 26.0–34.0)
MCHC: 34.4 g/dL (ref 30.0–36.0)
MCV: 94.2 fL (ref 78.0–100.0)
PLATELETS: 71 10*3/uL — AB (ref 150–400)
RBC: 3.77 MIL/uL — ABNORMAL LOW (ref 4.22–5.81)
RDW: 16 % — ABNORMAL HIGH (ref 11.5–15.5)
WBC: 7.7 10*3/uL (ref 4.0–10.5)

## 2013-09-04 MED ORDER — DEXTROSE 5 % IV SOLN
2.0000 g | INTRAVENOUS | Status: DC
  Administered 2013-09-04 – 2013-09-05 (×2): 2 g via INTRAVENOUS
  Filled 2013-09-04 (×3): qty 2

## 2013-09-04 NOTE — Progress Notes (Signed)
Clinical Social Work Department BRIEF PSYCHOSOCIAL ASSESSMENT 09/04/2013  Patient:  Alex Wise, Alex Wise     Account Number:  [id number]     Admit date:  08/31/2013  Clinical Social Worker:  Ulyess Blossom  Date/Time:  09/04/2013 11:30 AM  Referred by:  Physician  Date Referred:  09/04/2013 Referred for  SNF Placement   Other Referral:   Interview type:  Patient Other interview type:    PSYCHOSOCIAL DATA Living Status:  ALONE Admitted from facility:   Level of care:   Primary support name:  Alvester Chou Kopko/son/[phone] Primary support relationship to patient:  CHILD, ADULT Degree of support available:   adequate    CURRENT CONCERNS Current Concerns  Post-Acute Placement   Other Concerns:    SOCIAL WORK ASSESSMENT / PLAN CSW received referral for potential SNF. CSW reviewed chart and noted that PT recommending Home Health PT vs. SNF.    CSW and RNCM met with pt at bedside. CSW introduced self and explained role. Pt reports that he lives alone, but has a friend who checks on him three times a day. CSW discussed recommendations from PT evaluation for Home Health PT vs SNF depending on progress. CSW explained short term rehab and pt states that he feels that he can manage at home. Pt agreeable to [facility]. CSW recommended pt having HH CSW at home in the case that pt unable to manage pt care at home and needs to transition to SNF from home. Pt agreeable.    CSW notified pt that if he changes his mind and wants to explore SNF then CSW available to assist.    CSW to continue to follow.   Assessment/plan status:  Psychosocial Support/Ongoing Assessment of Needs Other assessment/ plan:   discharge planning   Information/referral to community resources:   RNCM following for [facility] needs, pt declining SNF at this time    PATIENT'S/FAMILY'S RESPONSE TO PLAN OF CARE: Pt alert and oriented x 4. Pt feels that he will be able to manage care at home with the assistance with home  health services. Pt expressed understanding that if pt would like to explore SNF then CSW available for assistance. Pt appreciative of CSW and RNCM assistance.    [name], MSW, [place] Work [phone]

## 2013-09-04 NOTE — Progress Notes (Signed)
Patient's friend -Lucendia Herrlich- talked with patient regarding short term rehab after DC. Patient considering doing this instead of going home at DC. Dean Goldner Berneta Levins

## 2013-09-04 NOTE — Care Management Note (Addendum)
    Page 1 of 2   09/06/2013     12:58:49 PM CARE MANAGEMENT NOTE 09/06/2013  Patient:  Alex Wise, Alex Wise   Account Number:  [id number]  Date Initiated:  09/04/2013  Documentation initiated by:  [facility]  Subjective/Objective Assessment:   adm: abdominal pain     Action/Plan:   Anticipated DC Date:  09/06/2013   Anticipated DC Plan:  [place]  CM consult      [facility] Choice  HOME HEALTH   Choice offered to / List presented to:  C-1 Patient        [place] arranged  HH-1 RN  [place].   Status of service:  Completed, signed off Medicare Important Message given?  YES (If response is "NO", the following Medicare IM given date fields will be blank) Date Medicare IM given:   Date Additional Medicare IM given:  09/04/2013  Discharge Disposition:  [place]  Per UR Regulation:    If discussed at Long Length of Stay Meetings, dates discussed:    Comments:  09/06/13 12:51 CM notified [facility] rep Kristen, pt discharging today with HHPT/OT/RN/aide.  No other CM needs were communicated.  Mariane Masters, Durel Salts [phone]. 09/04/13 12:45 CM met with pt and CSW in room to discuss pt's wishes for post discharge.  Though PT recc. SNF pt is politely but adamantly stating he wishes to go home. Though Pt lives alone,  Pt has a long term (30 years) relationship with a woman who visits daily and an adult son who checks on him daily.  Pt does not have a Life Alert device but states he wears his phone around his neck constantly at home.  CM gave pt a list of home health agencies and if [facility] is ordered would like those services rendered by [facility]. Address and contact information verified with pt.  Request for [facility] orders made.  Tentative referral called [facility] rep, Kristen. Pt has a cane at home and a "moderately" elevated toilet seat (he states he does not want a 3n1), and would accept a rolling  walker if recc by PT.   Secondary IM placed in shadow chargt and copy given to pt.  Will continue to follow for disposition.  Mariane Masters, BSN, CM [phone].

## 2013-09-04 NOTE — Progress Notes (Signed)
     [facility] Progress Note  Subjective:  Does not look as perky as yesterday.  Still with abdominal pain.  Objective:  Vital signs in last 24 hours: Temp:  [97.5 F (36.4 C)-98.5 F (36.9 C)] 97.9 F (36.6 C) (06/02 0419) Pulse Rate:  [69-75] 73 (06/02 0419) Resp:  [14-20] 14 (06/02 0419) BP: (82-115)/(48-60) 82/54 mmHg (06/02 0419) SpO2:  [94 %-100 %] 96 % (06/02 0419) Last BM Date: 08/31/13 General:  Alert, chronically ill-appearing, in NAD Heart:  Regular rate and rhythm; no murmurs Pulm:  CTAB.  No W/R/R. Abdomen:  Soft, but somewhat distended with ascites fluid. Normal bowel sounds.  Diffuse TTP.   Extremities:  Without edema. Neurologic:  Alert and  oriented x4;  grossly normal neurologically. Psych:  Alert and cooperative. Normal mood and affect.  Intake/Output from previous day: 06/01 0701 - 06/02 0700 In: 1130 [P.O.:1080; IV Piggyback:50] Out: 1025 [Urine:1025]  Lab Results:  Recent Labs  09/02/13 0510 09/03/13 0432 09/04/13 0435  WBC 10.4 7.5 7.7  HGB 13.9 12.1* 12.2*  HCT 38.8* 34.6* 35.5*  PLT 82* 71* 71*   BMET  Recent Labs  09/02/13 0510 09/03/13 0432 09/04/13 0435  NA 130* 131* 132*  K 4.0 3.4* 3.7  CL 92* 93* 92*  CO2 21 23 24   GLUCOSE 151* 127* 166*  BUN 45* 43* 40*  CREATININE 1.64* 1.53* 1.48*  CALCIUM 8.2* 8.4 8.5   LFT  Recent Labs  09/02/13 0510  PROT 5.8*  ALBUMIN 2.5*  AST 37  ALT 13  ALKPHOS 114  BILITOT 3.3*   PT/INR  Recent Labs  09/02/13 0510  LABPROT 17.3*  INR 1.45   Assessment / Plan: 1. SBP with new development of ascites: Paracentesis with 5.6 Liters of turbid fluid removed on 5/29. Received IV albumin on 5/29 and 5/31. Is on IV cefotaxime since 5/29 as well. Started low dose diuretics with lasix 20 mg PO daily and spironolactone 50 mg PO daily on 5/31. Needs close monitoring of renal function and lytes. ? Repeat paracentesis prior to discharge, maybe tomorrow, 6/3, with repeat cell count  on fluid.  He is on 2 gram sodium diet. 2. 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 Patient is status post EGD with banding of varices for bleeding December 2013. Is on nadolol 40 mg daily.  3. Hx of CAD / CABG 1993. Last cardiac cath was August 2012, see report for details.  4. Hx of chronic lung disease, ? Asthma  5. Diabetes, on insulin  6. Hyponatremia/hypokalemia: Improving.  Likely at least in part secondary to his liver disease. Repletion per primary service.      LOS: 4 days   Princella Pellegrini. Zehr  09/04/2013, 8:55 AM  Pager number [phone]   Attending MD note:   I have taken a history, examined the patient, and reviewed the chart. I agree with the Advanced Practitioner's impression and recommendations. Switch to Recephin  From [place]. Paracenthesis tomorrow  abou 2-3 liters, send for culture, cell count.  Renak function improving.  [facility] Pager # [phone]

## 2013-09-04 NOTE — Evaluation (Signed)
Occupational Therapy Evaluation Patient Details Name: Alex AlpersDaniel D Wise MRN: [id number] DOB: 1931-05-12 Today's Date: 09/04/2013    History of Present Illness 80 yo male admitted with abd pain, ascites. hx of liver cirrhosis, HTN, IDDM, asthma, CVA. Pt lives alone   Clinical Impression   Pt states he lives alone and son checks in on him in the evenings. He will benefit from continued OT services to improve strength, safety and independence with ADL. Unless he has 24/7 assist at d/c, feel [facility]    Follow Up Recommendations  SNF;Supervision/Assistance - 24 hour    Equipment Recommendations  3 in 1 bedside comode    Recommendations for Other Services       Precautions / Restrictions Precautions Precautions: Fall Precaution Comments: monitor BP Restrictions Weight Bearing Restrictions: No      Mobility Bed Mobility                  Transfers Overall transfer level: Needs assistance Equipment used: None Transfers: Sit to/from Stand Sit to Stand: Min assist         General transfer comment: min assist to steady, control descent.    Balance                                            ADL Overall ADL's : Needs assistance/impaired Eating/Feeding: Set up;Sitting   Grooming: Wash/dry hands;Set up;Sitting   Upper Body Bathing: Set up;Sitting;Supervision/ safety   Lower Body Bathing: Minimal assistance;Sit to/from stand   Upper Body Dressing : Set up;Sitting   Lower Body Dressing: Minimal assistance;Sit to/from stand   Toilet Transfer: Minimal assistance Toilet Transfer Details (indicate cue type and reason): sit to stand. Toileting- Clothing Manipulation and Hygiene: Minimal assistance;Sit to/from stand         General ADL Comments: Pt's BP in chair 90/48 and standing 96/47. Pt reports no dizziness with activity. Pt able to cross LEs up to don/doff socks. When asked about d/c plan, he states he isnt  sure yet. He lives alone and has a son that checks in on him in the evenings. He needs min assist for balance in standing for support.      Vision                     Perception     Praxis      Pertinent Vitals/Pain No complaint of 90/48 sit 96/47 standing     Hand Dominance     Extremity/Trunk Assessment Upper Extremity Assessment Upper Extremity Assessment: Overall WFL for tasks assessed       Cervical / Trunk Assessment Cervical / Trunk Assessment: Kyphotic   Communication Communication Communication: No difficulties   Cognition Arousal/Alertness: Awake/alert Behavior During Therapy: WFL for tasks assessed/performed Overall Cognitive Status: Within Functional Limits for tasks assessed                     General Comments       Exercises       Shoulder Instructions      Home Living Family/patient expects to be discharged to:: Private residence Living Arrangements: Alone   Type of Home: House Home Access: Stairs to enter Enter[employer] CorporationEntrance Stairs-Number of Steps: 4   Home Layout: One level     Bathroom Shower/Tub: Producer, television/film/videoWalk-in shower   Bathroom Toilet: Standard  Home Equipment: Cane - single point          Prior Functioning/Environment Level of Independence: Independent with assistive device(s)        Comments: uses cane intermittently. son checks in on pt in the evenings. Pt states he does his own housework, meals.    OT Diagnosis: Generalized weakness   OT Problem List: Decreased strength;Decreased knowledge of use of DME or AE   OT Treatment/Interventions: Self-care/ADL training;Patient/family education;Therapeutic activities;DME and/or AE instruction    OT Goals(Current goals can be found in the care plan section) Acute Rehab OT Goals Patient Stated Goal: none stated OT Goal Formulation: With patient Time For Goal Achievement: 09/18/13 Potential to Achieve Goals: Good  OT Frequency: Min 2X/week   Barriers to D/C:             Co-evaluation              End of Session    Activity Tolerance: Patient tolerated treatment well Patient left: in chair;with call bell/phone within reach   Time: 1221-1240 OT Time Calculation (min): 19 min Charges:  OT General Charges $OT Visit: 1 Procedure OT Evaluation $Initial OT Evaluation Tier I: 1 Procedure OT Treatments $Self Care/Home Management : 8-22 mins G-Codes:    Sabino Gasser Zia Najera [phone] 09/04/2013, 1:12 PM

## 2013-09-04 NOTE — Progress Notes (Signed)
Reviewed and agree with management. Sho Salguero D. Neeka Urista, M.D., FACG  

## 2013-09-04 NOTE — Progress Notes (Signed)
[facility] PROGRESS NOTE   Alex Wise IRS:[id number] DOB: 1931/11/09 DOA: 08/31/2013 PCP: Dorrene German, MD  HPI/Subjective: No further fever, denies significant complaints. PT recommended home health PT versus SNF.  Assessment/Plan: Principal Problem:   Abdominal pain Active Problems:   DIABETES, TYPE 2   Cirrhosis of liver   Ascites   Generalized weakness   SBP -Diffuse abdominal pain, started in periumbilical area.  -Paracentesis done yesterday, consistent with SBP with WBC of 4920 and 87% are neutrophils, -Patient started on cefotaxime yesterday, gastroenterology following. -Patient is on heart healthy diet, no nausea or vomiting. -No bowel movement, lactulose added for constipation rather than for encephalopathy. -GI recommended to be the paracentesis in a.m.  Bacteremia -1/2 of blood cultures showing Streptococcus viridans. -Likely to be contaminant, repeat culture today.  Hypoglycemia -Patient had episode of hypoglycemia with blood sugar went down to 29. Likely secondary to insulin. -Treated with portion of D50, was on D5W since yesterday, we'll discontinue his blood sugar > 150 today. -Blood sugars around 120-180 without the insulin  Ascites  -Secondary to cirrhosis of the liver, patient not on diuretics.  -Hold on starting diuresis as patient has acute renal failure.  -Has +1 pedal edema.   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  -Patient is on beta blockers, he is AST and ALT within normal limits.  -Hyponatremia, thrombocytopenia and hypoalbuminemia secondary to cirrhosis.   Diabetes mellitus type 2  -Patient is on 70/30 insulin mix, decrease the dose from 60 to 40 while is in the hospital.  -Check hemoglobin A1c, start carb modified diet after the paracentesis.  -Developed hypoglycemia with CBG of 43 this morning, likely secondary to poor oral intake, hold long-acting insulin.  Acute renal failure  -Last  records of his creatinine was in August of 2014 which was 1.3. Presented with creatinine of 2.46. -Creatinine already improved to 1.48. -Follow creatinine closely.  Hypokalemia -Replete with oral supplements.  Code Status: Full code Family Communication: Plan discussed with the patient. Disposition Plan: Remains inpatient   Consultants:  Gastroenterology  Procedures:  Paracentesis with removal 5.6 L of ascitic fluid  Antibiotics:  Cefotaxime   Objective: Filed Vitals:   09/04/13 0419  BP: 82/54  Pulse: 73  Temp: 97.9 F (36.6 C)  Resp: 14    Intake/Output Summary (Last 24 hours) at 09/04/13 1013 Last data filed at 09/04/13 0420  Gross per 24 hour  Intake    890 ml  Output    875 ml  Net     15 ml   Filed Weights   08/31/13 1249  Weight: 76.4 kg (168 lb 6.9 oz)    Exam: General: Alert and awake, oriented x3, not in any acute distress. HEENT: anicteric sclera, pupils reactive to light and accommodation, EOMI CVS: S1-S2 clear, no murmur rubs or gallops Chest: clear to auscultation bilaterally, no wheezing, rales or rhonchi Abdomen: soft nontender, nondistended, normal bowel sounds, no organomegaly Extremities: no cyanosis, clubbing or edema noted bilaterally Neuro: Cranial nerves II-XII intact, no focal neurological deficits  Data Reviewed: Basic Metabolic Panel:  Recent Labs Lab 09/01/13 0540 09/01/13 1305 09/02/13 0510 09/03/13 0432 09/04/13 0435  NA 131* 132* 130* 131* 132*  K 3.2* 3.8 4.0 3.4* 3.7  CL 91* 90* 92* 93* 92*  CO2 25 24 21 23 24   GLUCOSE 43* 62* 151* 127* 166*  BUN 50* 48* 45* 43* 40*  CREATININE 2.08* 1.92* 1.64* 1.53* 1.48*  CALCIUM 8.4 8.6 8.2*  8.4 8.5   Liver Function Tests:  Recent Labs Lab 08/31/13 1350 09/02/13 0510  AST 27 37  ALT 13 13  ALKPHOS 118* 114  BILITOT 5.8* 3.3*  PROT 6.3 5.8*  ALBUMIN 2.1* 2.5*   No results found for this basename: LIPASE, AMYLASE,  in the last 168 hours  Recent Labs Lab  09/03/13 1021  AMMONIA 16   CBC:  Recent Labs Lab 08/31/13 1350 09/01/13 0540 09/02/13 0510 09/03/13 0432 09/04/13 0435  WBC 14.4* 12.3* 10.4 7.5 7.7  HGB 13.6 12.5* 13.9 12.1* 12.2*  HCT 37.7* 34.8* 38.8* 34.6* 35.5*  MCV 91.5 92.3 92.2 93.0 94.2  PLT 172 125* 82* 71* 71*   Cardiac Enzymes: No results found for this basename: CKTOTAL, CKMB, CKMBINDEX, TROPONINI,  in the last 168 hours BNP (last 3 results) No results found for this basename: PROBNP,  in the last 8760 hours CBG:  Recent Labs Lab 09/03/13 1642 09/03/13 2001 09/04/13 0004 09/04/13 0417 09/04/13 0748  GLUCAP 138* 145* 163* 140* 160*    Micro Recent Results (from the past 240 hour(s))  CULTURE, BLOOD (ROUTINE X 2)     Status: None   Collection Time    08/31/13  2:11 PM      Result Value Ref Range Status   Specimen Description BLOOD LEFT ARM   Final   Special Requests     Final   Value: BOTTLES DRAWN AEROBIC AND ANAEROBIC 10CC BOTH BOTTLES   Culture  Setup Time     Final   Value: 08/31/2013 21:41     Performed at [facility]   Culture     Final   Value: VIRIDANS STREPTOCOCCUS     Note: THE SIGNIFICANCE OF ISOLATING THIS ORGANISM FROM A SINGLE SET OF BLOOD CULTURES WHEN MULTIPLE SETS ARE DRAWN IS UNCERTAIN. PLEASE NOTIFY THE MICROBIOLOGY DEPARTMENT WITHIN ONE WEEK IF SPECIATION AND SENSITIVITIES ARE REQUIRED.     Note: Gram Stain Report Called to,Read Back By and Verified With: MELVINA MOYE 09/01/13 @ 8:27PM BY RUSCOE A.     Performed at [facility]   Report Status 09/03/2013 FINAL   Final  BODY FLUID CULTURE     Status: None   Collection Time    08/31/13  3:03 PM      Result Value Ref Range Status   Specimen Description PERITONEAL   Final   Special Requests NONE   Final   Gram Stain     Final   Value: NO WBC SEEN     NO ORGANISMS SEEN     Performed at [facility]   Culture     Final   Value: NO GROWTH 2 DAYS     Performed at [facility]   Report Status  PENDING   Incomplete  CULTURE, BLOOD (ROUTINE X 2)     Status: None   Collection Time    08/31/13  3:40 PM      Result Value Ref Range Status   Specimen Description BLOOD LEFT ARM   Final   Special Requests BOTTLES DRAWN AEROBIC AND ANAEROBIC 5CC   Final   Culture  Setup Time     Final   Value: 08/31/2013 21:42     Performed at [facility]   Culture     Final   Value:        BLOOD CULTURE RECEIVED NO GROWTH TO DATE CULTURE WILL BE HELD FOR 5 DAYS BEFORE ISSUING A FINAL NEGATIVE REPORT  Performed at [facility]   Report Status PENDING   Incomplete  CULTURE, BLOOD (ROUTINE X 2)     Status: None   Collection Time    09/03/13 11:15 AM      Result Value Ref Range Status   Specimen Description BLOOD RIGHT ARM   Final   Special Requests BOTTLES DRAWN AEROBIC AND ANAEROBIC 10CC   Final   Culture  Setup Time     Final   Value: 09/03/2013 15:18     Performed at [facility]   Culture     Final   Value:        BLOOD CULTURE RECEIVED NO GROWTH TO DATE CULTURE WILL BE HELD FOR 5 DAYS BEFORE ISSUING A FINAL NEGATIVE REPORT     Performed at [facility]   Report Status PENDING   Incomplete  CULTURE, BLOOD (ROUTINE X 2)     Status: None   Collection Time    09/03/13 11:18 AM      Result Value Ref Range Status   Specimen Description BLOOD LEFT ARM   Final   Special Requests BOTTLES DRAWN AEROBIC AND ANAEROBIC 5CC   Final   Culture  Setup Time     Final   Value: 09/03/2013 15:18     Performed at [facility]   Culture     Final   Value:        BLOOD CULTURE RECEIVED NO GROWTH TO DATE CULTURE WILL BE HELD FOR 5 DAYS BEFORE ISSUING A FINAL NEGATIVE REPORT     Performed at [facility]   Report Status PENDING   Incomplete     Studies: No results found.  Scheduled Meds: . cefoTAXime (CLAFORAN) IV  2 g Intravenous 3 times per day  . furosemide  20 mg Oral Daily  . heparin  5,000 Units Subcutaneous 3 times per day  . insulin aspart  0-9  Units Subcutaneous TID WC  . lactulose  20 g Oral Daily  . nadolol  40 mg Oral QAC breakfast  . pantoprazole  40 mg Oral BID  . spironolactone  50 mg Oral Daily   Continuous Infusions:       Time spent: 35 minutes    Clydia Llano  [facility] Pager [phone] If 7PM-7AM, please contact night-coverage at [email or web address], password [email or web address] 09/04/2013, 10:13 AM  LOS: 4 days

## 2013-09-05 ENCOUNTER — Inpatient Hospital Stay (HOSPITAL_COMMUNITY): Payer: Medicare Other

## 2013-09-05 LAB — GLUCOSE, CAPILLARY
GLUCOSE-CAPILLARY: 171 mg/dL — AB (ref 70–99)
Glucose-Capillary: 147 mg/dL — ABNORMAL HIGH (ref 70–99)
Glucose-Capillary: 154 mg/dL — ABNORMAL HIGH (ref 70–99)
Glucose-Capillary: 140 mg/dL — ABNORMAL HIGH (ref 70–99)
Glucose-Capillary: 171 mg/dL — ABNORMAL HIGH (ref 70–99)
Glucose-Capillary: 141 mg/dL — ABNORMAL HIGH (ref 70–99)

## 2013-09-05 LAB — COMPREHENSIVE METABOLIC PANEL
ALBUMIN: 2.5 g/dL — AB (ref 3.5–5.2)
ALT: 12 U/L (ref 0–53)
AST: 23 U/L (ref 0–37)
Alkaline Phosphatase: 114 U/L (ref 39–117)
BILIRUBIN TOTAL: 3.4 mg/dL — AB (ref 0.3–1.2)
BUN: 34 mg/dL — ABNORMAL HIGH (ref 6–23)
CHLORIDE: 92 meq/L — AB (ref 96–112)
CO2: 26 mEq/L (ref 19–32)
Calcium: 8.7 mg/dL (ref 8.4–10.5)
Creatinine, Ser: 1.29 mg/dL (ref 0.50–1.35)
GFR calc Af Amer: 59 mL/min — ABNORMAL LOW (ref >90)
GFR calc non Af Amer: 51 mL/min — ABNORMAL LOW (ref >90)
Glucose, Bld: 167 mg/dL — ABNORMAL HIGH (ref 70–99)
Potassium: 3.5 mEq/L — ABNORMAL LOW (ref 3.7–5.3)
Sodium: 131 mEq/L — ABNORMAL LOW (ref 137–147)
Total Protein: 6.2 g/dL (ref 6.0–8.3)

## 2013-09-05 LAB — BODY FLUID CELL COUNT WITH DIFFERENTIAL
Lymphs, Fluid: 45 %
Monocyte-Macrophage-Serous Fluid: 20 % — ABNORMAL LOW (ref 50–90)
NEUTROPHIL FLUID: 35 % — AB (ref 0–25)
WBC FLUID: 471 uL (ref 0–1000)

## 2013-09-05 LAB — PATHOLOGIST SMEAR REVIEW

## 2013-09-05 NOTE — Progress Notes (Addendum)
Patient ID: Alex Wise, male   DOB: 07-03-1931, 80 y.o.   MRN: [id number]  [facility] PROGRESS NOTE  Alex Wise ZCH:[id number] DOB: 12-11-1931 DOA: 08/31/2013 PCP: Dorrene German, MD  Brief narrative: 80 y.o. male with past medical history of cryptogenic cirrhosis, HTN and IDDM. Patient sent from his gastroenterologist office for further evaluation of abdominal pain that started several weeks prior to this admission. The pain started as periumbilical and then involved entire abdomen. [facility] admitted directly from GI office.   Assessment/Plan: SBP   Diffuse abdominal pain, started in periumbilical area.   Paracentesis done 6/1, consistent with SBP with WBC of 4920 and 87% are neutrophils,   Patient started on Rocephin 6/2, gastroenterology following.   Patient is on heart healthy diet, no nausea or vomiting. Tolerating well   Lactulose added for constipation rather than for encephalopathy.   S/P second paracentesis 6/3, 2.6 L fluid remove and pt tolerated well  Bacteremia   1/2 of blood cultures showing Streptococcus viridans.   Likely to be contaminant, repeat culture pending   Hypoglycemia   Patient had episode of hypoglycemia with blood sugar went down to 29. Likely secondary to insulin.   Treated with portion of D50, responded well   Blood sugars around 120-180 without the insulin which is the desired target  Ascites   Secondary to cirrhosis of the liver, continue Lasix   Monitor renal function closely   Has +1 pedal edema.  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   Patient is on beta blockers, AST and ALT within normal limits.   Hyponatremia, thrombocytopenia and hypoalbuminemia secondary to cirrhosis.  Diabetes mellitus type 2   Patient is on 70/30 insulin mix initially but due to hypoglycemia this was temporarily stopped   Carb modified diet after the paracentesis.  Acute renal failure   Last records of his  creatinine was in August of 2014 which was 1.3. Presented with creatinine of 2.46.   Cr trending down and is WNL this AM Hypokalemia   Replete with oral supplements.   Code Status: Full code  Family Communication: Plan discussed with the patient.  Disposition Plan: Remains inpatient   Consultants:  Gastroenterology Procedures:  Paracentesis with removal 5.6 L of ascitic fluid 6/1 Paracentesis with removal 2.6 L of ascitic fluid 6/3 Antibiotics:  Rocephin 6/2 -->  [name], MD  [facility] Pager [phone]  If 7PM-7AM, please contact night-coverage [email or web address] Password [email or web address] 09/05/2013, 1:31 PM   LOS: 5 days    HPI/Subjective: No acute overnight events.  Objective: Filed Vitals:   09/05/13 0416 09/05/13 0959 09/05/13 1011 09/05/13 1028  BP: 114/69 99/59 97/54  93/59  Pulse: 68     Temp: 98.4 F (36.9 C)     TempSrc: Oral     Resp: 16     Height:      Weight:      SpO2: 98%       Intake/Output Summary (Last 24 hours) at 09/05/13 1331 Last data filed at 09/05/13 0936  Gross per 24 hour  Intake    720 ml  Output    325 ml  Net    395 ml    Exam:   General:  Pt is alert, follows commands appropriately, not in acute distress  Cardiovascular: Regular rate and rhythm, S1/S2, no murmurs  Respiratory: Clear to auscultation bilaterally, no wheezing, no crackles, no rhonchi  Abdomen: Soft, non tender, distended, bowel sounds present  Extremities: +  1 edema, pulses DP and PT palpable bilaterally  Neuro: Grossly nonfocal  Data Reviewed: Basic Metabolic Panel:  Recent Labs Lab 09/01/13 1305 09/02/13 0510 09/03/13 0432 09/04/13 0435 09/05/13 0333  NA 132* 130* 131* 132* 131*  K 3.8 4.0 3.4* 3.7 3.5*  CL 90* 92* 93* 92* 92*  CO2 24 21 23 24 26   GLUCOSE 62* 151* 127* 166* 167*  BUN 48* 45* 43* 40* 34*  CREATININE 1.92* 1.64* 1.53* 1.48* 1.29  CALCIUM 8.6 8.2* 8.4 8.5 8.7   Liver Function Tests:  Recent Labs Lab 08/31/13 1350  09/02/13 0510 09/05/13 0333  AST 27 37 23  ALT 13 13 12   ALKPHOS 118* 114 114  BILITOT 5.8* 3.3* 3.4*  PROT 6.3 5.8* 6.2  ALBUMIN 2.1* 2.5* 2.5*    Recent Labs Lab 09/03/13 1021  AMMONIA 16   CBC:  Recent Labs Lab 08/31/13 1350 09/01/13 0540 09/02/13 0510 09/03/13 0432 09/04/13 0435  WBC 14.4* 12.3* 10.4 7.5 7.7  HGB 13.6 12.5* 13.9 12.1* 12.2*  HCT 37.7* 34.8* 38.8* 34.6* 35.5*  MCV 91.5 92.3 92.2 93.0 94.2  PLT 172 125* 82* 71* 71*   CBG:  Recent Labs Lab 09/04/13 2020 09/04/13 2354 09/05/13 0413 09/05/13 0744 09/05/13 1242  GLUCAP 237* 171* 147* 154* 140*    Recent Results (from the past 240 hour(s))  CULTURE, BLOOD (ROUTINE X 2)     Status: None   Collection Time    08/31/13  2:11 PM      Result Value Ref Range Status   Specimen Description BLOOD LEFT ARM   Final   Special Requests     Final   Value: BOTTLES DRAWN AEROBIC AND ANAEROBIC 10CC BOTH BOTTLES   Culture  Setup Time     Final   Value: 08/31/2013 21:41     Performed at [facility]   Culture     Final   Value: VIRIDANS STREPTOCOCCUS     Note: THE SIGNIFICANCE OF ISOLATING THIS ORGANISM FROM A SINGLE SET OF BLOOD CULTURES WHEN MULTIPLE SETS ARE DRAWN IS UNCERTAIN. PLEASE NOTIFY THE MICROBIOLOGY DEPARTMENT WITHIN ONE WEEK IF SPECIATION AND SENSITIVITIES ARE REQUIRED.     Note: Gram Stain Report Called to,Read Back By and Verified With: MELVINA MOYE 09/01/13 @ 8:27PM BY RUSCOE A.     Performed at [facility]   Report Status 09/03/2013 FINAL   Final  BODY FLUID CULTURE     Status: None   Collection Time    08/31/13  3:03 PM      Result Value Ref Range Status   Specimen Description PERITONEAL   Final   Special Requests NONE   Final   Gram Stain     Final   Value: NO WBC SEEN     NO ORGANISMS SEEN     Performed at [facility]   Culture     Final   Value: NO GROWTH 3 DAYS     Performed at [facility]   Report Status 09/04/2013 FINAL   Final  CULTURE,  BLOOD (ROUTINE X 2)     Status: None   Collection Time    08/31/13  3:40 PM      Result Value Ref Range Status   Specimen Description BLOOD LEFT ARM   Final   Special Requests BOTTLES DRAWN AEROBIC AND ANAEROBIC 5CC   Final   Culture  Setup Time     Final   Value: 08/31/2013 21:42  Performed at [facility]   Culture     Final   Value:        BLOOD CULTURE RECEIVED NO GROWTH TO DATE CULTURE WILL BE HELD FOR 5 DAYS BEFORE ISSUING A FINAL NEGATIVE REPORT     Performed at [facility]   Report Status PENDING   Incomplete  CULTURE, BLOOD (ROUTINE X 2)     Status: None   Collection Time    09/03/13 11:15 AM      Result Value Ref Range Status   Specimen Description BLOOD RIGHT ARM   Final   Special Requests BOTTLES DRAWN AEROBIC AND ANAEROBIC 10CC   Final   Culture  Setup Time     Final   Value: 09/03/2013 15:18     Performed at [facility]   Culture     Final   Value:        BLOOD CULTURE RECEIVED NO GROWTH TO DATE CULTURE WILL BE HELD FOR 5 DAYS BEFORE ISSUING A FINAL NEGATIVE REPORT     Performed at [facility]   Report Status PENDING   Incomplete  CULTURE, BLOOD (ROUTINE X 2)     Status: None   Collection Time    09/03/13 11:18 AM      Result Value Ref Range Status   Specimen Description BLOOD LEFT ARM   Final   Special Requests BOTTLES DRAWN AEROBIC AND ANAEROBIC 5CC   Final   Culture  Setup Time     Final   Value: 09/03/2013 15:18     Performed at [facility]   Culture     Final   Value:        BLOOD CULTURE RECEIVED NO GROWTH TO DATE CULTURE WILL BE HELD FOR 5 DAYS BEFORE ISSUING A FINAL NEGATIVE REPORT     Performed at [facility]   Report Status PENDING   Incomplete    Scheduled Meds: . cefTRIAXone (ROCEPHIN)  IV  2 g Intravenous Q24H  . furosemide  20 mg Oral Daily  . insulin aspart  0-9 Units Subcutaneous TID WC  . lactulose  20 g Oral Daily  . nadolol  40 mg Oral QAC breakfast  . pantoprazole  40 mg Oral BID   . spironolactone  50 mg Oral Daily   Continuous Infusions:

## 2013-09-05 NOTE — Progress Notes (Signed)
      Gastroenterology Progress Note  Subjective:  Getting ready to go to paracentesis.  Feels ok today.  Objective:  Vital signs in last 24 hours: Temp:  [98 F (36.7 C)-98.4 F (36.9 C)] 98.4 F (36.9 C) (06/03 0416) Pulse Rate:  [68-72] 68 (06/03 0416) Resp:  [16-18] 16 (06/03 0416) BP: (90-123)/(47-69) 114/69 mmHg (06/03 0416) SpO2:  [98 %-99 %] 98 % (06/03 0416) Last BM Date: 09/02/13 General:  Alert, chronically ill-appearing, in NAD Heart:  Regular rate and rhythm; no murmurs Pulm:  CTAB.  No W/R/R. Abdomen:  Soft, but somewhat distended with ascitic fluid. Normal bowel sounds.  Diffuse TTP.   Extremities:  Without edema. Neurologic:  Alert and  oriented x4;  grossly normal neurologically. Psych:  Alert and cooperative. Normal mood and affect.  Intake/Output from previous day: 06/02 0701 - 06/03 0700 In: 720 [P.O.:720] Out: 350 [Urine:350] Intake/Output this shift: Total I/O In: 240 [P.O.:240] Out: 100 [Urine:100]  Lab Results:  Recent Labs  09/03/13 0432 09/04/13 0435  WBC 7.5 7.7  HGB 12.1* 12.2*  HCT 34.6* 35.5*  PLT 71* 71*   BMET  Recent Labs  09/03/13 0432 09/04/13 0435 09/05/13 0333  NA 131* 132* 131*  K 3.4* 3.7 3.5*  CL 93* 92* 92*  CO2 23 24 26   GLUCOSE 127* 166* 167*  BUN 43* 40* 34*  CREATININE 1.53* 1.48* 1.29  CALCIUM 8.4 8.5 8.7   LFT  Recent Labs  09/05/13 0333  PROT 6.2  ALBUMIN 2.5*  AST 23  ALT 12  ALKPHOS 114  BILITOT 3.4*   Assessment / Plan: 1. SBP with new development of ascites: Paracentesis with 5.6 Liters of turbid fluid removed on 5/29. Received IV albumin on 5/29 and 5/31.  Abx switched from IV cefotaxime to IV Rocephin on 6/2 due to shortage of cefotaxime. Started low dose diuretics with lasix 20 mg PO daily and spironolactone 50 mg PO daily on 5/31. Needs close monitoring of renal function and lytes; renal function is improving well.  Repeat paracentesis today with repeat cell count/culture on  fluid. He is on 2 gram sodium diet.  2. 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 Patient is status post EGD with banding of varices for bleeding December 2013. Is on nadolol 40 mg daily.  3. Hx of CAD / CABG 1993. Last cardiac cath was August 2012, see report for details.  4. Hx of chronic lung disease, ? Asthma  5. Diabetes, on insulin  6. Hyponatremia/hypokalemia: Improving/stable. Likely at least in part secondary to his liver disease. Repletion per primary service.     LOS: 5 days   Alex Wise. Zehr  09/05/2013, 9:58 AM  Pager number [phone] Attending MD note:   I have taken a history, examined the patient, and reviewed the chart. I agree with the Advanced Practitioner's impression and recommendations. Renal function almost back to normal. Paracenthesis completed- clear fluid, 2.6 L, Pt may be ready for D/C tomorrow  [facility] Pager # [phone]

## 2013-09-05 NOTE — Procedures (Signed)
Successful US guided paracentesis from RLQ.  Yielded 2.6L of clear yellow fluid.  No immediate complications.  Pt tolerated well.   Specimen was sent for labs.  Brayton El PA-C 09/05/2013 10:29 AM

## 2013-09-05 NOTE — Progress Notes (Signed)
CSW continuing to follow.  CSW reviewed chart and noted that RN placed progress note stating that  Patient's friend -Alex Wise- talked with patient regarding short term rehab after DC. Patient considering doing this instead of going home at DC.  CSW met with pt at bedside to discuss. CSW discussed continued recommendation for short term rehab and that CSW noted that pt and pt friend, Alex Wise had discussed short term rehab and per note, pt considering. Per pt, he is not agreeable to short term rehab at [facility]. Pt adamant that he plans to return home with home health services and feels that he will be able to manage at home. CSW expressed understanding and notified pt that CSW will remain available to assist with SNF placement if pt becomes agreeable.  CSW to continue to follow.  Alex Wise, MSW, [place] [employer] [phone]

## 2013-09-06 ENCOUNTER — Other Ambulatory Visit: Payer: Self-pay | Admitting: Nurse Practitioner

## 2013-09-06 DIAGNOSIS — N179 Acute kidney failure, unspecified: Secondary | ICD-10-CM

## 2013-09-06 LAB — GLUCOSE, CAPILLARY
Glucose-Capillary: 133 mg/dL — ABNORMAL HIGH (ref 70–99)
Glucose-Capillary: 134 mg/dL — ABNORMAL HIGH (ref 70–99)
Glucose-Capillary: 154 mg/dL — ABNORMAL HIGH (ref 70–99)

## 2013-09-06 LAB — COMPREHENSIVE METABOLIC PANEL
ALT: 11 U/L (ref 0–53)
AST: 25 U/L (ref 0–37)
Albumin: 2.3 g/dL — ABNORMAL LOW (ref 3.5–5.2)
Alkaline Phosphatase: 106 U/L (ref 39–117)
BUN: 29 mg/dL — ABNORMAL HIGH (ref 6–23)
CHLORIDE: 95 meq/L — AB (ref 96–112)
CO2: 25 meq/L (ref 19–32)
Calcium: 8.6 mg/dL (ref 8.4–10.5)
Creatinine, Ser: 1.13 mg/dL (ref 0.50–1.35)
GFR calc non Af Amer: 60 mL/min — ABNORMAL LOW (ref >90)
GFR, EST AFRICAN AMERICAN: 69 mL/min — AB (ref >90)
GLUCOSE: 138 mg/dL — AB (ref 70–99)
Potassium: 3.8 mEq/L (ref 3.7–5.3)
SODIUM: 132 meq/L — AB (ref 137–147)
Total Bilirubin: 3.5 mg/dL — ABNORMAL HIGH (ref 0.3–1.2)
Total Protein: 6.1 g/dL (ref 6.0–8.3)

## 2013-09-06 LAB — CULTURE, BLOOD (ROUTINE X 2): Culture: NO GROWTH

## 2013-09-06 MED ORDER — SPIRONOLACTONE 50 MG PO TABS: 50.0000 mg | ORAL_TABLET | Freq: Every day | ORAL | Status: DC

## 2013-09-06 MED ORDER — FUROSEMIDE 20 MG PO TABS: 20.0000 mg | ORAL_TABLET | Freq: Every day | ORAL | Status: DC

## 2013-09-06 MED ORDER — SODIUM CHLORIDE 0.9 % IV BOLUS (SEPSIS)
250.0000 mL | Freq: Once | INTRAVENOUS | Status: AC
  Administered 2013-09-06: 250 mL via INTRAVENOUS

## 2013-09-06 MED ORDER — SULFAMETHOXAZOLE-TMP DS 800-160 MG PO TABS: 1.0000 | ORAL_TABLET | Freq: Two times a day (BID) | ORAL | Status: DC

## 2013-09-06 MED ORDER — LACTULOSE 10 GM/15ML PO SOLN: 20.0000 g | Freq: Every day | ORAL | Status: AC

## 2013-09-06 MED ORDER — INSULIN ASPART 100 UNIT/ML ~~LOC~~ SOLN: 0.0000 [IU] | Freq: Three times a day (TID) | SUBCUTANEOUS | Status: DC

## 2013-09-06 MED ORDER — OXYCODONE HCL 5 MG PO TABS: 5.0000 mg | ORAL_TABLET | ORAL | Status: DC | PRN

## 2013-09-06 MED ORDER — ONDANSETRON HCL 4 MG PO TABS
4.0000 mg | ORAL_TABLET | Freq: Four times a day (QID) | ORAL | Status: DC | PRN
Start: 2013-09-06 — End: 2013-12-28

## 2013-09-06 NOTE — Progress Notes (Signed)
Physical Therapy Treatment Patient Details Name: Alex Wise MRN: [id number] DOB: [birth year] Today's Date: 09/06/2013    History of Present Illness 80 yo male admitted with abd pain, ascites. hx of liver cirrhosis, HTN, IDDM, asthma, CVA. Pt lives alone. Paracentesis performed 09/05/13    PT Comments    Progressing slowly with mobility. Pt able to tolerate a short ambulation distance this session. Feel pt could benefit greatly from ST rehab at SNF prior to returning home (if he would agree), however pt refuses placement. Will need 24/7 supervision/assist at home for safe mobilization in home.   Follow Up Recommendations  SNF;Supervision/Assistance - 24 hour (pt refuses. So, HHPT and 24 hour supervision/assist)     Equipment Recommendations  Rolling walker with 5" wheels    Recommendations for Other Services OT consult     Precautions / Restrictions Precautions Precautions: Fall Precaution Comments: monitor BP Restrictions Weight Bearing Restrictions: No    Mobility  Bed Mobility Overal bed mobility: Needs Assistance Bed Mobility: Sit to Supine     Supine to sit: HOB elevated;Min guard Sit to supine: Min assist   General bed mobility comments: Increased time. Small amount of assist for LEs onto bed.   Transfers Overall transfer level: Needs assistance Equipment used: Rolling walker (2 wheeled) Transfers: Sit to/from Stand Sit to Stand: Min guard         General transfer comment: close guard for safety. Multimodal cues for safety, technique, hand placement. Increased time.   Ambulation/Gait Ambulation/Gait assistance: Min guard Ambulation Distance (Feet): 125 Feet Assistive device: Rolling walker (2 wheeled) Gait Pattern/deviations: Decreased stride length;Trunk flexed;Step-through pattern     General Gait Details: very slow gait speed. VCs safety, distance from RW. close guard for safety   Stairs            Wheelchair Mobility    Modified Rankin  (Stroke Patients Only)       Balance                                    Cognition Arousal/Alertness: Awake/alert Behavior During Therapy: WFL for tasks assessed/performed Overall Cognitive Status: Within Functional Limits for tasks assessed                      Exercises      General Comments        Pertinent Vitals/Pain Bottom-unrated. Assisted pt out of chair and then repositioned in bed.     Home Living                      Prior Function            PT Goals (current goals can now be found in the care plan section) Progress towards PT goals: Progressing toward goals (slowly)    Frequency  Min 3X/week    PT Plan Current plan remains appropriate    Co-evaluation             End of Session Equipment Utilized During Treatment: Gait belt Activity Tolerance: Patient limited by fatigue Patient left: in bed;with call bell/phone within reach;with family/visitor present     Time: 1310-1333 PT Time Calculation (min): 23 min  Charges:  $Gait Training: 8-22 mins $Therapeutic Activity: 8-22 mins                    G Codes:  Weston Anna, MPT Pager: [phone]

## 2013-09-06 NOTE — Progress Notes (Signed)
CSW met with pt and pt friends at bedside to provide SNF bed offers.  Pt agreeable to [facility].  Pt reports that pt son would be individual to complete admission paperwork. CSW contacted pt son via telephone with pt permission in order to discuss pt decision for SNF and discuss need for pt son assistance with admission paperwork.  CSW contacted facility who confirmed that pt could be admitted to facility today. [facility] is coordinating with pt son about admission paperwork.  CSW to facilitate pt discharge needs this afternoon.  [name], MSW, [place] [employer] [phone]

## 2013-09-06 NOTE — Discharge Instructions (Signed)
Ascites °Ascites is a gathering of fluid in the belly (abdomen). This is most often caused by liver disease. It may also be caused by a number of other less common problems. It causes a ballooning out (distension) of the abdomen. °CAUSES  °Scarring of the liver (cirrhosis) is the most common cause of ascites. Other causes include: °· Infection or inflammation in the abdomen. °· Cancer in the abdomen. °· Heart failure. °· Certain forms of kidney failure (nephritic syndrome). °· Inflammation of the pancreas. °· Clots in the veins of the liver. °SYMPTOMS  °In the early stages of ascites, you may not have any symptoms. The main symptom of ascites is a sense of abdominal bloating. This is due to the presence of fluid. This may also cause an increase in abdominal or waist size. People with this condition can develop swelling in the legs, and men can develop a swollen scrotum. When there is a lot of fluid, it may be hard to breath. Stretching of the abdomen by fluid can be painful. °DIAGNOSIS  °Certain features of your medical history, such as a history of liver disease and of an enlarging abdomen, can suggest the presence of ascites. The diagnosis of ascites can be made on physical exam by your caregiver. An abdominal ultrasound examination can confirm that ascites is present, and estimate the amount of fluid. °Once ascites is confirmed, it is important to determine its cause. Again, a history of one of the conditions listed in "CAUSES" provides a strong clue. A physical exam is important, and blood and X-ray tests may be needed. During a procedure called paracentesis, a sample of fluid is removed from the abdomen. This can determine certain key features about the fluid, such as whether or not infection or cancer is present. Your caregiver will determine if a paracentesis is necessary. They will describe the procedure to you. °PREVENTION  °Ascites is a complication of other conditions. Therefore to prevent ascites, you  must seek treatment for any significant health conditions you have. Once ascites is present, careful attention to fluid and salt intake may help prevent it from getting worse. If you have ascites, you should not drink alcohol. °PROGNOSIS  °The prognosis of ascites depends on the underlying disease. If the disease is reversible, such as with certain infections or with heart failure, then ascites may improve or disappear. When ascites is caused by cirrhosis, then it indicates that the liver disease has worsened, and further evaluation and treatment of the liver disease is needed. If your ascites is caused by cancer, then the success or failure of the cancer treatment will determine whether your ascites will improve or worsen. °RISKS AND COMPLICATIONS  °Ascites is likely to worsen if it is not properly diagnosed and treated. A large amount of ascites can cause pain and difficulty breathing. The main complication, besides worsening, is infection (called spontaneous bacterial peritonitis). This requires prompt treatment. °TREATMENT  °The treatment of ascites depends on its cause. When liver disease is your cause, medical management using water pills (diuretics) and decreasing salt intake is often effective. Ascites due to peritoneal inflammation or malignancy (cancer) alone does not respond to salt restriction and diuretics. Hospitalization is sometimes required. °If the treatment of ascites cannot be managed with medications, a number of other treatments are available. Your caregivers will help you decide which will work best for you. Some of these are: °· Removal of fluid from the abdomen (paracentesis). °· Fluid from the abdomen is passed into a vein (peritoneovenous shunting). °·   Liver transplantation. °· Transjugular intrahepatic portosystemic stent shunt. °HOME CARE INSTRUCTIONS  °It is important to monitor body weight and the intake and output of fluids. Weigh yourself at the same time every day. Record your  weights. Fluid restriction may be necessary. It is also important to know your salt intake. The more salt you take in, the more fluid you will retain. Ninety percent of people with ascites respond to this approach. °· Follow any directions for medicines carefully. °· Follow up with your caregiver, as directed. °· Report any changes in your health, especially any new or worsening symptoms. °· If your ascites is from liver disease, avoid alcohol and other substances toxic to the liver. °SEEK MEDICAL CARE IF:  °· Your weight increases more than a few pounds in a few days. °· Your abdominal or waist size increases. °· You develop swelling in your legs. °· You had swelling and it worsens. °SEEK IMMEDIATE MEDICAL CARE IF:  °· You develop a fever. °· You develop new abdominal pain. °· You develop difficulty breathing. °· You develop confusion. °· You have bleeding from the mouth, stomach, or rectum. °MAKE SURE YOU:  °· Understand these instructions. °· Will watch your condition. °· Will get help right away if you are not doing well or get worse. °Document Released: 03/22/2005 Document Revised: 06/14/2011 Document Reviewed: 10/21/2006 °[employer]® Patient Information ©2014 [employer]. ° °

## 2013-09-06 NOTE — Progress Notes (Signed)
Occupational Therapy Treatment Patient Details Name: Alex Wise MRN: [id number] DOB: 11-19-1931 Today's Date: 09/06/2013    History of present illness 80 yo male admitted with abd pain, ascites. hx of liver cirrhosis, HTN, IDDM, asthma, CVA. Pt lives alone. Paracentesis performed 09/05/13   OT comments  Pt is very slow to perform all tasks. Took increased time to transfer to EOB and he attempted to don socks. He was able to don R but not L sock. He states he will have assist at home and he wants to d/c home, not SNF. Pt reports feeling very "tired" and not sleeping well.  Will benefit from continued OT services to improve safety and independence with self care tasks. Per nursing, pt supposed to d/c home today. If so, recommend HHOT and an aide and 24/7 assist.     Follow Up Recommendations  SNF;Supervision/Assistance - 24 hour    Equipment Recommendations  3 in 1 bedside comode (pt declines 3in1 however, he would benefit from one)    Recommendations for Other Services      Precautions / Restrictions Precautions Precautions: Fall Precaution Comments: monitor BP Restrictions Weight Bearing Restrictions: No       Mobility Bed Mobility Overal bed mobility: Needs Assistance Bed Mobility: Supine to Sit     Supine to sit: HOB elevated;Min guard     General bed mobility comments: max increased time.  Transfers Overall transfer level: Needs assistance Equipment used: None Transfers: Sit to/from Stand Sit to Stand: Min assist         General transfer comment: min assist to rise from EOB and min guard to stand from toilet with grab bar.     Balance                                   ADL       Grooming: Wash/dry hands;Oral care;Standing;Min guard               Lower Body Dressing: Minimal assistance;Sit to/from stand   Toilet Transfer: Minimal assistance;Ambulation;Comfort height toilet;Grab bars   Toileting- Clothing Manipulation and Hygiene:  Sit to/from stand;Min guard         General ADL Comments: Discussed 3in1 with pt but he refuses 3in1. He states his toilet will be sufficient and he has a grab bar. Pt reports feeling very "tired" and not sleeping well while here. Required max increased time to complete all tasks. Spent almost 1 hour with pt to sit on EOB and don socks, toilet and brush his teeth at the sink as pt moving very slowly. He refuses to use walker and tends to hold to furniture, wall, etc. He had difficulty donning L sock at EOB today but states he has a friend that will be helping him at home and can help with dressing. He required min assist to steady throughout transfer to toilet as he was unsteady without walker and reaching out to hold to objects. Pt refusing to go to [facility] and states he will be fine once he is home. If pt d/c home, he will need 24/7 assist.       Vision                     Perception     Praxis      Cognition   Behavior During Therapy: [facility] for tasks assessed/performed Overall Cognitive Status: Within Functional Limits for tasks assessed  Extremity/Trunk Assessment               Exercises     Shoulder Instructions       General Comments      Pertinent Vitals/ Pain       No complaint of  Home Living                                          Prior Functioning/Environment              Frequency Min 2X/week     Progress Toward Goals  OT Goals(current goals can now be found in the care plan section)  Progress towards OT goals: Progressing toward goals     Plan Discharge plan remains appropriate    Co-evaluation                 End of Session Equipment Utilized During Treatment: Gait belt   Activity Tolerance Patient limited by fatigue   Patient Left in chair;with call bell/phone within reach   Nurse Communication          Time: 1127-1220 OT Time Calculation (min): 53 min  Charges: OT  General Charges $OT Visit: 1 Procedure OT Treatments $Self Care/Home Management : 23-37 mins $Therapeutic Activity: 23-37 mins  Lennox LaityStephanie Stafford Estil Vallee [phone] 09/06/2013, 12:59 PM

## 2013-09-06 NOTE — Progress Notes (Addendum)
Clinical Social Work Department CLINICAL SOCIAL WORK PLACEMENT NOTE 09/06/2013  Patient:  MAKANI, MARCHESANI  Account Number:  [id number] Admit date:  08/31/2013  Clinical Social Worker:  Jacelyn Grip  Date/time:  09/06/2013 02:00 PM  Clinical Social Work is seeking post-discharge placement for this patient at the following level of care:   SKILLED NURSING   (*CSW will update this form in Epic as items are completed)   09/06/2013  Patient/family provided with [facility] Department of Clinical Social Work's list of facilities offering this level of care within the geographic area requested by the patient (or if unable, by the patient's family).  09/06/2013  Patient/family informed of their freedom to choose among providers that offer the needed level of care, that participate in Medicare, Medicaid or managed care program needed by the patient, have an available bed and are willing to accept the patient.  09/06/2013  Patient/family informed of [facility]' ownership interest in [facility], as well as of the fact that they are under no obligation to receive care at this facility.  PASARR submitted to EDS on 09/06/2013 PASARR number received from EDS on 09/06/2013  FL2 transmitted to all facilities in geographic area requested by pt/family on  09/06/2013 FL2 transmitted to all facilities within larger geographic area on   Patient informed that his/her managed care company has contracts with or will negotiate with  certain facilities, including the following:     Patient/family informed of bed offers received:  09/06/2013 Patient chooses bed at [facility] Physician recommends and patient chooses bed at    Patient to be transferred to  on  [facility] on 09/06/2013 Patient to be transferred to facility by ambulance Sharin Mons)  The following physician request were entered in Epic:   Additional Comments:   Loletta Specter, MSW, LCSW Clinical  Social Work [phone]

## 2013-09-06 NOTE — Progress Notes (Signed)
    Progress Note   Subjective  no abdominal pain. Overall feels okay.    Objective   Vital signs in last 24 hours: Temp:  [98.2 F (36.8 C)-98.3 F (36.8 C)] 98.2 F (36.8 C) (06/04 0402) Pulse Rate:  [68-72] 69 (06/04 0402) Resp:  [16-18] 18 (06/04 0402) BP: (84-99)/(42-59) 97/54 mmHg (06/04 0535) SpO2:  [96 %-98 %] 96 % (06/04 0402) Last BM Date: 09/02/13 General:    Pleasant white male in NAD Heart:  Regular rate and rhythm,  Murmur heard Lungs: Respirations even and unlabored, lungs CTA bilaterally Abdomen:  Soft, nontender and nondistended. Normal bowel sounds. Extremities:  Without edema. Neurologic:  Alert and oriented,  grossly normal neurologically. Psych:  Cooperative. Normal mood and affect.    Lab Results:  Recent Labs  09/04/13 0435  WBC 7.7  HGB 12.2*  HCT 35.5*  PLT 71*   BMET  Recent Labs  09/04/13 0435 09/05/13 0333 09/06/13 0357  NA 132* 131* 132*  K 3.7 3.5* 3.8  CL 92* 92* 95*  CO2 24 26 25   GLUCOSE 166* 167* 138*  BUN 40* 34* 29*  CREATININE 1.48* 1.29 1.13  CALCIUM 8.5 8.7 8.6   LFT  Recent Labs  09/06/13 0357  PROT 6.1  ALBUMIN 2.3*  AST 25  ALT 11  ALKPHOS 106  BILITOT 3.5*     Assessment / Plan:   1. Decompensated cirrhosis with new onset ascites / SBP.  He had another 2 liters of ascitic fluid removed yesterday. Fluid neutrophil count improved and no longer meets criteria for SBP but culture is pending. On 7th day of IV antibiotics and feels much better. Stable for discharge from GI standpoint. He should be on daily antibiotic for SBP prophylaxis (Daily Bactrim DS is okay). Home on diuretics, 2gram sodium diet. I have made him an office appointment with me in 2 weeks.   2. AKI, resolving. On lasix 20mg  daily and aldactone 50mg  daily. Will need to have BMET in a week at our office. I will put orders in [facility] for that.     LOS: 6 days   Meredith Pel  09/06/2013, 8:46 AM  Attending MD note:   I have taken a  history, examined the patient, and reviewed the chart. I agree with the Advanced Practitioner's impression and recommendations. Renal function back to normal. Continue  Bactrim for 10 -14 days.  Willa Rough [facility] Pager # [phone]

## 2013-09-06 NOTE — Discharge Summary (Signed)
Physician Discharge Summary  Alex Wise:[id number] DOB: 1932-02-25 DOA: 08/31/2013  PCP: Dorrene German, MD  Admit date: 08/31/2013 Discharge date: 09/06/2013  Recommendations for Outpatient Follow-up:  1. please followup with gastroenterology in one week per scheduled appointment. 2. please continue Bactrim twice a day for 10 days on discharge for treatment/prevention of bacterial peritonitis 3. continue Lasix and nadolol as prescribed per GI recommendations. Please note that we also added spironolactone 50 mg daily. 4. your blood sugars were on a lower side so you may continue taking sliding scale insulin as prescribed.  Discharge Diagnoses:  Principal Problem:   Abdominal pain Active Problems:   DIABETES, TYPE 2   Cirrhosis of liver   Ascites   Generalized weakness    Discharge Condition: stable   Diet recommendation: as tolerated   History of present illness:  80 y.o. male with past medical history of cryptogenic cirrhosis, HTN and IDDM. Patient sent from his gastroenterologist office for further evaluation of abdominal pain that started several weeks prior to this admission. The pain started as periumbilical and then involved entire abdomen. TRH admitted directly from GI office.   Assessment/Plan:   SBP   Paracentesis done 6/1, consistent with SBP with WBC of 4920 and 87% are neutrophils. Neutrophil count improved and no longer meets criteria for SBP but culture is pending. Per GI, pt needs 10-14 days of bactrim, so far prescribed 10 days so we can see how kidney function is. If it remains stable then per GI bactrim may be continued to full 14 days. He did receive rocephin 09/04/2013 until discahrge.  On discharge pt will continue lasix, lactulose, nadolol and spironolactone   S/P second paracentesis 6/3, 2.6 L fluid remove and pt tolerated well  Bacteremia   1/2 of blood cultures showing Streptococcus viridans.   Likely to be contaminant, repeat culture showed  no growth to date  Hypoglycemia   Patient had episode of hypoglycemia with blood sugar went down to 29. Likely secondary to insulin.   Treated with portion of D50, responded well   Blood sugars around 120-180 without the insulin which is the desired target   He may continue sliding scale insulin only on discharge  Ascites   Secondary to cirrhosis of the liver, continue Lasix   Has +1 pedal edema.  Cirrhosis of the liver   Per GI notes, cryptogenic cirrhosis, likely NASH.   Patient is on beta blockers, AST and ALT within normal limits.   Hyponatremia, thrombocytopenia and hypoalbuminemia secondary to cirrhosis.  Diabetes mellitus type 2  Only on SSI on discharge due to low CBG's Acute renal failure  Last records of his creatinine was in August of 2014 which was 1.3. Presented with creatinine of 2.46.  Cr trending down and is WNL  Hypokalemia  Repleted with oral supplements.  Please note we did not provide prescription for potassium, he is on spironolactone which may lead to increased potassium level  Blood work to be checked in GI office in 1 week   Code Status: Full code  Family Communication: Plan discussed with the patient.   Consultants:  Gastroenterology Procedures:  Paracentesis with removal 5.6 L of ascitic fluid 6/1  Paracentesis with removal 2.6 L of ascitic fluid 6/3 Antibiotics:  Rocephin 6/2 --> 09/06/2013   Signed:  [name], MD  [facility] 09/06/2013, 12:34 PM  Pager #: [phone]   Discharge Exam: Filed Vitals:   09/06/13 0535  BP: 97/54  Pulse:   Temp:   Resp:  Filed Vitals:   09/05/13 1427 09/05/13 2030 09/06/13 0402 09/06/13 0535  BP: 85/51 99/43 84/42  97/54  Pulse: 72 68 69   Temp:  98.3 F (36.8 C) 98.2 F (36.8 C)   TempSrc: Oral Oral Oral   Resp: 16 18 18    Height:      Weight:      SpO2: 98% 97% 96%     General: Pt is not in distress Cardiovascular: Regular rate and rhythm, S1/S2 +, no  murmurs Respiratory: bilateral air entry, no wheezing Abdominal: Soft, non tender, non distended, bowel sounds +, no guarding Extremities: no cyanosis, pulses palpable bilaterally DP and PT; +1 LE edema Neuro: Grossly nonfocal  Discharge Instructions  Discharge Instructions   Call MD for:  difficulty breathing, headache or visual disturbances    Complete by:  As directed      Call MD for:  persistant dizziness or light-headedness    Complete by:  As directed      Call MD for:  persistant nausea and vomiting    Complete by:  As directed      Call MD for:  severe uncontrolled pain    Complete by:  As directed      Diet - low sodium heart healthy    Complete by:  As directed      Discharge instructions    Complete by:  As directed   1. please followup with gastroenterology in one week per scheduled appointment. 2. please continue Bactrim twice a day for 10 days on discharge for treatment/prevention of bacterial peritonitis 3. continue Lasix and nadolol as prescribed per GI recommendations. Please note that we also added spironolactone 50 mg daily. 4. your blood sugars were on a lower side so you may continue taking sliding scale insulin as prescribed.     Increase activity slowly    Complete by:  As directed             Medication List    STOP taking these medications       insulin NPH-regular Human (70-30) 100 UNIT/ML injection  Commonly known as:  NOVOLIN 70/30      TAKE these medications       furosemide 20 MG tablet  Commonly known as:  LASIX  Take 1 tablet (20 mg total) by mouth daily.     insulin aspart 100 UNIT/ML injection  Commonly known as:  novoLOG  Inject 0-9 Units into the skin 3 (three) times daily with meals.     lactulose 10 GM/15ML solution  Commonly known as:  CHRONULAC  Take 30 mLs (20 g total) by mouth daily.     nadolol 40 MG tablet  Commonly known as:  CORGARD  Take 1 tablet (40 mg total) by mouth daily before breakfast.     ondansetron 4 MG  tablet  Commonly known as:  ZOFRAN  Take 1 tablet (4 mg total) by mouth every 6 (six) hours as needed for nausea.     oxyCODONE 5 MG immediate release tablet  Commonly known as:  Oxy IR/ROXICODONE  Take 1 tablet (5 mg total) by mouth every 4 (four) hours as needed for moderate pain.     pantoprazole 40 MG tablet  Commonly known as:  PROTONIX  Take 1 tablet (40 mg total) by mouth 2 (two) times daily.     spironolactone 50 MG tablet  Commonly known as:  ALDACTONE  Take 1 tablet (50 mg total) by mouth daily.     sulfamethoxazole-trimethoprim 800-160  MG per tablet  Commonly known as:  BACTRIM DS  Take 1 tablet by mouth 2 (two) times daily.           Follow-up Information   Follow up with Willette Cluster, NP On 09/20/2013. (at 10:30am)    Specialty:  Nurse Practitioner   Contact information:   [address] [place] [address] [phone]       Follow up with Fleet Contras A, MD. Schedule an appointment as soon as possible for a visit in 2 weeks.   Specialty:  Internal Medicine   Contact information:   [address] [place] [address] [phone]        The results of significant diagnostics from this hospitalization (including imaging, microbiology, ancillary and laboratory) are listed below for reference.    Significant Diagnostic Studies: US Paracentesis 09/05/2013 IMPRESSION: Successful ultrasound guided paracentesis yielding 2.6 L. of ascites.  Read by: Brayton El PA-C   Electronically Signed   By: Richarda Overlie M.D.   On: 09/05/2013 10:32   US Paracentesis 08/31/2013    IMPRESSION: Successful ultrasound guided diagnostic and therapeutic paracentesis yielding 5.6 liters of ascites.  Read by: Jeananne Rama ,P.A.-C.   Electronically Signed   By: Irish Lack M.D.   On: 08/31/2013 15:03    Microbiology: CULTURE, BLOOD (ROUTINE X 2)     Status: None   Collection Time    08/31/13  2:11 PM      Result Value Ref Range Status   Specimen Description BLOOD  LEFT ARM   Final   Special Requests     Final   Value: BOTTLES DRAWN AEROBIC AND ANAEROBIC 10CC BOTH BOTTLES   Culture  Setup Time     Final   Value: 08/31/2013 21:41     Performed at [facility]   Culture     Final   Value: VIRIDANS STREPTOCOCCUS     Performed at [facility]   Report Status 09/03/2013 FINAL   Final  BODY FLUID CULTURE     Status: None   Collection Time    08/31/13  3:03 PM      Result Value Ref Range Status   Specimen Description PERITONEAL   Final   Value: NO GROWTH 3 DAYS     Performed at [facility]   Report Status 09/04/2013 FINAL   Final  CULTURE, BLOOD (ROUTINE X 2)     Status: None   Collection Time    08/31/13  3:40 PM      Result Value Ref Range Status   Specimen Description BLOOD LEFT ARM   Final   Value: NO GROWTH 5 DAYS     Performed at [facility]   Report Status 09/06/2013 FINAL   Final  CULTURE, BLOOD (ROUTINE X 2)     Status: None   Collection Time    09/03/13 11:15 AM      Result Value Ref Range Status   Specimen Description BLOOD RIGHT ARM   Final   Value:        BLOOD CULTURE RECEIVED NO GROWTH TO DATE      Performed at [facility]   Report Status PENDING   Incomplete  CULTURE, BLOOD (ROUTINE X 2)     Status: None   Collection Time    09/03/13 11:18 AM      Result Value Ref Range Status   Specimen Description BLOOD LEFT ARM   Final   Value:  BLOOD CULTURE RECEIVED NO GROWTH TO DATE      [facility]   Report Status PENDING   Incomplete  BODY FLUID CULTURE     Status: None   Collection Time    09/05/13 10:13 AM      Result Value Ref Range Status   Specimen Description ASCITIC   Final   Value: NO GROWTH 1 DAY     [facility]formed at [facility]   Report Status PENDING   Incomplete     Labs: Basic Metabolic Panel:  Recent Labs Lab 09/02/13 0510 09/03/13 0432 09/04/13 0435 09/05/13 0333 09/06/13 0357  NA 130* 131* 132* 131* 132*  K 4.0 3.4* 3.7 3.5*  3.8  CL 92* 93* 92* 92* 95*  CO2 21 23 24 26 25   GLUCOSE 151* 127* 166* 167* 138*  BUN 45* 43* 40* 34* 29*  CREATININE 1.64* 1.53* 1.48* 1.29 1.13  CALCIUM 8.2* 8.4 8.5 8.7 8.6   Liver Function Tests:  Recent Labs Lab 08/31/13 1350 09/02/13 0510 09/05/13 0333 09/06/13 0357  AST 27 37 23 25  ALT 13 13 12 11   ALKPHOS 118* 114 114 106  BILITOT 5.8* 3.3* 3.4* 3.5*  PROT 6.3 5.8* 6.2 6.1  ALBUMIN 2.1* 2.5* 2.5* 2.3*   No results found for this basename: LIPASE, AMYLASE,  in the last 168 hours  Recent Labs Lab 09/03/13 1021  AMMONIA 16   CBC:  Recent Labs Lab 08/31/13 1350 09/01/13 0540 09/02/13 0510 09/03/13 0432 09/04/13 0435  WBC 14.4* 12.3* 10.4 7.5 7.7  HGB 13.6 12.5* 13.9 12.1* 12.2*  HCT 37.7* 34.8* 38.8* 34.6* 35.5*  MCV 91.5 92.3 92.2 93.0 94.2  PLT 172 125* 82* 71* 71*   Cardiac Enzymes: No results found for this basename: CKTOTAL, CKMB, CKMBINDEX, TROPONINI,  in the last 168 hours BNP: BNP (last 3 results) No results found for this basename: PROBNP,  in the last 8760 hours CBG:  Recent Labs Lab 09/05/13 1616 09/05/13 2029 09/06/13 09/06/13 0400 09/06/13 1231  GLUCAP 171* 141* 154* 133* 134*    Time coordinating discharge: Over 30 minutes

## 2013-09-06 NOTE — Progress Notes (Signed)
Came to visit patient at bedside to discuss [facility] Care Management services. Discussed how SNF is recommended and appears to be a safer discharge plan for him then home. Patient now agreeable to SNF. His son and friend at bedside concur with SNF as well. Fortunately, inpatient LCSW was able to come right up to bedside to speak with patient again as he is now agreeable to SNF. Consents were still signed for [facility] Care Management services however. Appreciative of inpatient LCSW help with this patient.  Raiford Noble, [facility] [phone]

## 2013-09-06 NOTE — Progress Notes (Signed)
Pt BP 84/42. On call notified, orders received for 250 ml of NS bolus. At 0535 BP increased to 97/54. This RN to continue to monitor. Eugene Garnet RN

## 2013-09-06 NOTE — Progress Notes (Signed)
CSW received notification from [place], Marthenia Rolling that she was meeting with pt at bedside and pt son and pt friend were present. [place], Marthenia Rolling discussed again with pt recommendation for SNF and pt is now agreeable and pt son and pt friend feel that SNF would be safe discharge plan.   CSW met with pt and pt son and pt friend at bedside. CSW discussed SNF and pt now agreeable. Pt for discharge today and CSW discussed that CSW will initiate search to see options of facilities that could accept pt today. Pt agreeable.  CSW initiated SNF search and contacted [facility] at pt and pt son request.  CSW to follow up with bed offers for hopeful discharge today to SNF.  [name], MSW, [place] Work [phone]

## 2013-09-06 NOTE — Progress Notes (Signed)
Pt for discharge to [facility].  CSW facilitated pt discharge needs including contacting facility, faxing pt discharge information via [facility], discussing with pt at bedside and pt friends from church, notifying pt son, Gery Pray via telephone voicemail, providing RN phone number to call report, and arranging ambulance transport via [facility] for pt to [facility].  Pt expressed appreciation for CSW assistance in arranging SNF with such short notice.   No further social work needs identified at this time.  CSW signing off.   Loletta Specter, MSW, LCSW Clinical Social Work [phone]

## 2013-09-08 LAB — BODY FLUID CULTURE
Culture: NO GROWTH
Special Requests: NORMAL

## 2013-09-09 LAB — CULTURE, BLOOD (ROUTINE X 2)
CULTURE: NO GROWTH
Culture: NO GROWTH

## 2013-09-20 ENCOUNTER — Encounter: Payer: Self-pay | Admitting: Nurse Practitioner

## 2013-09-20 ENCOUNTER — Other Ambulatory Visit (INDEPENDENT_AMBULATORY_CARE_PROVIDER_SITE_OTHER): Payer: Medicare Other

## 2013-09-20 ENCOUNTER — Ambulatory Visit (INDEPENDENT_AMBULATORY_CARE_PROVIDER_SITE_OTHER): Payer: Medicare Other | Admitting: Nurse Practitioner

## 2013-09-20 ENCOUNTER — Telehealth: Payer: Self-pay | Admitting: Nurse Practitioner

## 2013-09-20 VITALS — BP 100/60 | HR 64 | Ht 65.5 in | Wt 143.0 lb

## 2013-09-20 DIAGNOSIS — K7460 Unspecified cirrhosis of liver: Secondary | ICD-10-CM

## 2013-09-20 LAB — BASIC METABOLIC PANEL
BUN: 19 mg/dL (ref 6–23)
CALCIUM: 8.8 mg/dL (ref 8.4–10.5)
CO2: 27 mEq/L (ref 19–32)
Chloride: 96 mEq/L (ref 96–112)
Creatinine, Ser: 1.4 mg/dL (ref 0.4–1.5)
GFR: 53.18 mL/min — AB (ref >60.00)
GLUCOSE: 237 mg/dL — AB (ref 70–99)
Potassium: 4.5 mEq/L (ref 3.5–5.1)
Sodium: 130 mEq/L — ABNORMAL LOW (ref 135–145)

## 2013-09-20 NOTE — Progress Notes (Signed)
     History of Present Illness:   Patient is a 80 year old male known to Dr. Arlyce Dice for history of cryptogenic cirrhosis. I saw him in the office 08/31/13 with abdominal pain and distention. We got him admitted through Hospitalist Service. He was treated for SBP and acute renal failure. Two LVP were done during hospital stay. Patient was discharged to [facility]. He is here with a close friend for a hospital follow up.    Patient has no abdominal pain. He wants to be discharged from [facility] to his own home. Patient's close friend is here and rightfully concerned about patient going home to live by himself   Current Medications, Allergies, Past Medical History, Past Surgical History, Family History and Social History were reviewed in [employer] record.  Physical Exam: General: Pleasant, white male in no acute distress Head: Normocephalic and atraumatic Eyes:  sclerae anicteric, conjunctiva pink  Ears: Normal auditory acuity Lungs: Clear throughout to auscultation Heart: Regular rate and rhythm Abdomen: Soft, non distended, non-tender. No masses, no hepatomegaly. Normal bowel sounds Musculoskeletal: Symmetrical with no gross deformities  Extremities: No edema  Neurological: Alert oriented x 4, grossly nonfocal Psychological:  Alert and cooperative. Jovial  Assessment and Recommendations:  30. 80 year old male with cryptogenic cirrhosis complicated by recent SBP. No further abdominal pain.   Continue 20 mg of Lasix and 50 mg of Aldactone daily.   Discussed importance of 2 g sodium diet (including the recognition of hidden salt and foods)  Continue daily Bactrim for SBP prophylaxis  Continue nadolol to reduce portal pressures  Patient is overdue for EGD / varices screening. At this point I do not think he has physically strong enough for the procedure. He will followup with me in 2 months time (or sooner depending on clinical course) and if stable will get  the EGD scheduled  Patient would like to leave [facility] and live at home. His close friend, and I, had a long discussion with him about this. Patient realizes that he needs to be physically capable of living independently and I don't think he is there yet.   2. AKI, resolved. Will check BMET to make sure renal function still okay since hospital discharge.

## 2013-09-20 NOTE — Patient Instructions (Signed)
Please go to the basement level to have your labs drawn.  

## 2013-09-24 NOTE — Progress Notes (Signed)
Reviewed and agree with management. June Rode D. Kazmir Oki, M.D., FACG  

## 2013-09-25 ENCOUNTER — Telehealth: Payer: Self-pay

## 2013-09-25 NOTE — Telephone Encounter (Signed)
Pt is scheduled for EGD with banding 09/27/13@12 :30pm. Spoke with Willette Cluster NP and pt was just discharged from the hospital and is to weak to have the procedure at this time. EGD with banding cancelled. Procedure cancelled with [facility]. Pt aware.

## 2013-09-27 ENCOUNTER — Ambulatory Visit (HOSPITAL_COMMUNITY): Admission: RE | Admit: 2013-09-27 | Payer: Medicare Other | Source: Ambulatory Visit | Admitting: Gastroenterology

## 2013-09-27 ENCOUNTER — Encounter (HOSPITAL_COMMUNITY): Admission: RE | Payer: Self-pay | Source: Ambulatory Visit

## 2013-09-27 SURGERY — EGD (ESOPHAGOGASTRODUODENOSCOPY)
Anesthesia: Monitor Anesthesia Care

## 2013-09-28 NOTE — Telephone Encounter (Signed)
Spoke with [employer] and he states patient's son is taking care of him.

## 2013-10-03 HISTORY — PX: OTHER SURGICAL HISTORY: SHX169

## 2013-10-04 ENCOUNTER — Telehealth: Payer: Self-pay | Admitting: Gastroenterology

## 2013-10-04 NOTE — Telephone Encounter (Signed)
Left message for pt to call back.  10/09/13@3pm -Left message for pt to call back.

## 2013-10-10 NOTE — Telephone Encounter (Signed)
After multiple attempts have been unable to reach pt. 

## 2013-12-27 ENCOUNTER — Ambulatory Visit (INDEPENDENT_AMBULATORY_CARE_PROVIDER_SITE_OTHER): Payer: Medicare Other | Admitting: Gastroenterology

## 2013-12-27 ENCOUNTER — Encounter: Payer: Self-pay | Admitting: Gastroenterology

## 2013-12-27 ENCOUNTER — Other Ambulatory Visit (INDEPENDENT_AMBULATORY_CARE_PROVIDER_SITE_OTHER): Payer: Medicare Other

## 2013-12-27 VITALS — BP 106/62 | HR 76 | Ht 65.5 in | Wt 142.0 lb

## 2013-12-27 DIAGNOSIS — K7469 Other cirrhosis of liver: Secondary | ICD-10-CM

## 2013-12-27 DIAGNOSIS — K7460 Unspecified cirrhosis of liver: Secondary | ICD-10-CM

## 2013-12-27 DIAGNOSIS — I8501 Esophageal varices with bleeding: Secondary | ICD-10-CM

## 2013-12-27 LAB — COMPREHENSIVE METABOLIC PANEL
ALK PHOS: 63 U/L (ref 39–117)
ALT: 10 U/L (ref 0–53)
AST: 25 U/L (ref 0–37)
Albumin: 2.8 g/dL — ABNORMAL LOW (ref 3.5–5.2)
BILIRUBIN TOTAL: 2.5 mg/dL — AB (ref 0.2–1.2)
BUN: 15 mg/dL (ref 6–23)
CO2: 31 mEq/L (ref 19–32)
Calcium: 9.1 mg/dL (ref 8.4–10.5)
Chloride: 98 mEq/L (ref 96–112)
Creatinine, Ser: 1.1 mg/dL (ref 0.4–1.5)
GFR: 67.06 mL/min (ref >60.00)
GLUCOSE: 151 mg/dL — AB (ref 70–99)
Potassium: 4.1 mEq/L (ref 3.5–5.1)
Sodium: 135 mEq/L (ref 135–145)
Total Protein: 7.2 g/dL (ref 6.0–8.3)

## 2013-12-27 LAB — CBC WITH DIFFERENTIAL/PLATELET
BASOS ABS: 0.1 10*3/uL (ref 0.0–0.1)
Basophils Relative: 1 % (ref 0.0–3.0)
Eosinophils Absolute: 0.2 10*3/uL (ref 0.0–0.7)
Eosinophils Relative: 3.1 % (ref 0.0–5.0)
HCT: 38.8 % — ABNORMAL LOW (ref 39.0–52.0)
Hemoglobin: 13 g/dL (ref 13.0–17.0)
LYMPHS ABS: 1.6 10*3/uL (ref 0.7–4.0)
LYMPHS PCT: 30.5 % (ref 12.0–46.0)
MCHC: 33.7 g/dL (ref 30.0–36.0)
MCV: 100.8 fl — ABNORMAL HIGH (ref 78.0–100.0)
MONOS PCT: 8 % (ref 3.0–12.0)
Monocytes Absolute: 0.4 10*3/uL (ref 0.1–1.0)
NEUTROS PCT: 57.4 % (ref 43.0–77.0)
Neutro Abs: 3 10*3/uL (ref 1.4–7.7)
PLATELETS: 100 10*3/uL — AB (ref 150.0–400.0)
RBC: 3.85 Mil/uL — ABNORMAL LOW (ref 4.22–5.81)
RDW: 16.7 % — AB (ref 11.5–15.5)
WBC: 5.2 10*3/uL (ref 4.0–10.5)

## 2013-12-27 LAB — PROTIME-INR
INR: 1.2 ratio — ABNORMAL HIGH (ref 0.8–1.0)
Prothrombin Time: 13.8 s — ABNORMAL HIGH (ref 9.6–13.1)

## 2013-12-27 NOTE — Assessment & Plan Note (Signed)
Patient has clinically stable hepatic function although he is now wheelchair bound with limited overall activity.  This is a reflection of his cirrhosis.  Recommendations #1 check alpha-fetoprotein, INR, comprehensive metabolic profile #2 nutritional supplements

## 2013-12-27 NOTE — Addendum Note (Signed)
Addended by: Marlowe Kays on: 12/27/2013 12:03 PM   Modules accepted: Orders

## 2013-12-27 NOTE — Patient Instructions (Signed)
Your procedure has been scheduled at [facility] Go to the basement for labs today Separate instructions have been given

## 2013-12-27 NOTE — Addendum Note (Signed)
Addended by: Marlowe Kays on: 12/27/2013 03:35 PM   Modules accepted: Orders

## 2013-12-27 NOTE — Progress Notes (Signed)
      History of Present Illness:  Mr. Hillier has returned for followup of cirrhosis.  At his last visit he had abdominal pain with new-onset ascites.  Paracentesis demonstrated a benign transudate.  There was no evidence for infection.  His main complaint is fatigue and poor appetite.  He no longer has abdominal pain.  He is unable to gain weight.    Review of Systems: Pertinent positive and negative review of systems were noted in the above HPI section. All other review of systems were otherwise negative.    Current Medications, Allergies, Past Medical History, Past Surgical History, Family History and Social History were reviewed in [employer] electronic medical record  Vital signs were reviewed in today's medical record. Physical Exam: General: Chronically ill-appearing male in no acute distress Skin: anicteric Head: Normocephalic and atraumatic Eyes:  sclerae anicteric, EOMI Ears: Normal auditory acuity Mouth: No deformity or lesions Lungs: Clear throughout to auscultation Heart: Regular rate and rhythm; no murmurs, rubs or bruits Abdomen: Soft, non tender and non distended.  There is no obvious ascites. No masses, hepatosplenomegaly or hernias noted. Normal Bowel sounds Rectal:deferred Musculoskeletal: Symmetrical with no gross deformities  Pulses:  Normal pulses noted Extremities: No clubbing, cyanosis, edema or deformities noted Neurological: Alert oriented x 4, grossly nonfocal Psychological:  Alert and cooperative. Normal mood and affect  See Assessment and Plan under Problem List

## 2013-12-27 NOTE — Assessment & Plan Note (Signed)
Plan to continue nadolol.  Patient is due for followup endoscopy

## 2013-12-28 ENCOUNTER — Encounter (HOSPITAL_COMMUNITY): Payer: Self-pay | Admitting: Pharmacy Technician

## 2013-12-28 ENCOUNTER — Encounter (HOSPITAL_COMMUNITY): Payer: Self-pay | Admitting: *Deleted

## 2013-12-28 LAB — AFP TUMOR MARKER: AFP TUMOR MARKER: 1.7 ng/mL (ref <6.1)

## 2014-01-08 ENCOUNTER — Encounter (HOSPITAL_COMMUNITY): Payer: Self-pay | Admitting: Anesthesiology

## 2014-01-09 ENCOUNTER — Encounter: Payer: Self-pay | Admitting: Gastroenterology

## 2014-01-10 ENCOUNTER — Encounter (HOSPITAL_COMMUNITY): Payer: Self-pay | Admitting: *Deleted

## 2014-01-23 ENCOUNTER — Encounter (HOSPITAL_COMMUNITY): Payer: Self-pay | Admitting: Emergency Medicine

## 2014-01-23 ENCOUNTER — Observation Stay (HOSPITAL_COMMUNITY)
Admission: EM | Admit: 2014-01-23 | Discharge: 2014-01-25 | Disposition: A | Discharge status: Home / Self Care | Payer: Medicare Other | Attending: Internal Medicine | Admitting: Internal Medicine

## 2014-01-23 DIAGNOSIS — D696 Thrombocytopenia, unspecified: Secondary | ICD-10-CM | POA: Diagnosis present

## 2014-01-23 DIAGNOSIS — I129 Hypertensive chronic kidney disease with stage 1 through stage 4 chronic kidney disease, or unspecified chronic kidney disease: Secondary | ICD-10-CM | POA: Insufficient documentation

## 2014-01-23 DIAGNOSIS — S299XXA Unspecified injury of thorax, initial encounter: Secondary | ICD-10-CM | POA: Diagnosis present

## 2014-01-23 DIAGNOSIS — Z87891 Personal history of nicotine dependence: Secondary | ICD-10-CM | POA: Insufficient documentation

## 2014-01-23 DIAGNOSIS — Z8673 Personal history of transient ischemic attack (TIA), and cerebral infarction without residual deficits: Secondary | ICD-10-CM | POA: Diagnosis not present

## 2014-01-23 DIAGNOSIS — E119 Type 2 diabetes mellitus without complications: Secondary | ICD-10-CM

## 2014-01-23 DIAGNOSIS — S36039A Unspecified laceration of spleen, initial encounter: Secondary | ICD-10-CM

## 2014-01-23 DIAGNOSIS — I8501 Esophageal varices with bleeding: Secondary | ICD-10-CM | POA: Insufficient documentation

## 2014-01-23 DIAGNOSIS — S2232XA Fracture of one rib, left side, initial encounter for closed fracture: Secondary | ICD-10-CM

## 2014-01-23 DIAGNOSIS — Y9289 Other specified places as the place of occurrence of the external cause: Secondary | ICD-10-CM | POA: Diagnosis not present

## 2014-01-23 DIAGNOSIS — N189 Chronic kidney disease, unspecified: Secondary | ICD-10-CM | POA: Diagnosis not present

## 2014-01-23 DIAGNOSIS — R531 Weakness: Secondary | ICD-10-CM

## 2014-01-23 DIAGNOSIS — K922 Gastrointestinal hemorrhage, unspecified: Secondary | ICD-10-CM

## 2014-01-23 DIAGNOSIS — Z794 Long term (current) use of insulin: Secondary | ICD-10-CM | POA: Diagnosis not present

## 2014-01-23 DIAGNOSIS — Y9389 Activity, other specified: Secondary | ICD-10-CM | POA: Diagnosis not present

## 2014-01-23 DIAGNOSIS — J90 Pleural effusion, not elsewhere classified: Secondary | ICD-10-CM | POA: Diagnosis not present

## 2014-01-23 DIAGNOSIS — W010XXS Fall on same level from slipping, tripping and stumbling without subsequent striking against object, sequela: Secondary | ICD-10-CM

## 2014-01-23 DIAGNOSIS — K652 Spontaneous bacterial peritonitis: Secondary | ICD-10-CM

## 2014-01-23 DIAGNOSIS — I252 Old myocardial infarction: Secondary | ICD-10-CM | POA: Diagnosis not present

## 2014-01-23 DIAGNOSIS — J942 Hemothorax: Secondary | ICD-10-CM

## 2014-01-23 DIAGNOSIS — S2242XA Multiple fractures of ribs, left side, initial encounter for closed fracture: Secondary | ICD-10-CM | POA: Diagnosis not present

## 2014-01-23 DIAGNOSIS — K7460 Unspecified cirrhosis of liver: Secondary | ICD-10-CM | POA: Diagnosis present

## 2014-01-23 DIAGNOSIS — Z9889 Other specified postprocedural states: Secondary | ICD-10-CM

## 2014-01-23 DIAGNOSIS — J449 Chronic obstructive pulmonary disease, unspecified: Secondary | ICD-10-CM

## 2014-01-23 DIAGNOSIS — K769 Liver disease, unspecified: Secondary | ICD-10-CM

## 2014-01-23 DIAGNOSIS — K7469 Other cirrhosis of liver: Secondary | ICD-10-CM

## 2014-01-23 DIAGNOSIS — S3992XA Unspecified injury of lower back, initial encounter: Secondary | ICD-10-CM | POA: Insufficient documentation

## 2014-01-23 DIAGNOSIS — S2249XA Multiple fractures of ribs, unspecified side, initial encounter for closed fracture: Secondary | ICD-10-CM | POA: Diagnosis present

## 2014-01-23 DIAGNOSIS — W010XXD Fall on same level from slipping, tripping and stumbling without subsequent striking against object, subsequent encounter: Secondary | ICD-10-CM

## 2014-01-23 DIAGNOSIS — R160 Hepatomegaly, not elsewhere classified: Secondary | ICD-10-CM | POA: Diagnosis present

## 2014-01-23 DIAGNOSIS — Z79899 Other long term (current) drug therapy: Secondary | ICD-10-CM | POA: Insufficient documentation

## 2014-01-23 DIAGNOSIS — S2239XA Fracture of one rib, unspecified side, initial encounter for closed fracture: Secondary | ICD-10-CM | POA: Diagnosis present

## 2014-01-23 DIAGNOSIS — W010XXA Fall on same level from slipping, tripping and stumbling without subsequent striking against object, initial encounter: Secondary | ICD-10-CM | POA: Insufficient documentation

## 2014-01-23 DIAGNOSIS — S2232XD Fracture of one rib, left side, subsequent encounter for fracture with routine healing: Secondary | ICD-10-CM

## 2014-01-23 DIAGNOSIS — R188 Other ascites: Secondary | ICD-10-CM

## 2014-01-23 DIAGNOSIS — J948 Other specified pleural conditions: Secondary | ICD-10-CM

## 2014-01-23 NOTE — ED Notes (Signed)
Pt presented from home by EMS, report of a fall 3 days ago, found on the floor a day later, no medical evaluation after the fall, denies LOC at the time of  Fall, pt lives alone, c/o of left rib and flank pain, head impact per pt, pt not on blood thinners, reports weakness legs bilaterally. Pt AOx4, in NAD.

## 2014-01-23 NOTE — ED Notes (Signed)
Bed: JQ49 Expected date:  Expected time:  Means of arrival:  Comments: EMS,

## 2014-01-24 ENCOUNTER — Telehealth: Payer: Self-pay | Admitting: Gastroenterology

## 2014-01-24 ENCOUNTER — Emergency Department (HOSPITAL_COMMUNITY): Payer: Medicare Other

## 2014-01-24 ENCOUNTER — Inpatient Hospital Stay (HOSPITAL_COMMUNITY): Payer: Medicare Other

## 2014-01-24 ENCOUNTER — Encounter (HOSPITAL_COMMUNITY): Payer: Self-pay | Admitting: Internal Medicine

## 2014-01-24 DIAGNOSIS — S2239XA Fracture of one rib, unspecified side, initial encounter for closed fracture: Secondary | ICD-10-CM | POA: Diagnosis present

## 2014-01-24 DIAGNOSIS — S2249XA Multiple fractures of ribs, unspecified side, initial encounter for closed fracture: Secondary | ICD-10-CM | POA: Diagnosis present

## 2014-01-24 DIAGNOSIS — S2232XA Fracture of one rib, left side, initial encounter for closed fracture: Secondary | ICD-10-CM

## 2014-01-24 DIAGNOSIS — S2242XA Multiple fractures of ribs, left side, initial encounter for closed fracture: Secondary | ICD-10-CM

## 2014-01-24 DIAGNOSIS — W010XXA Fall on same level from slipping, tripping and stumbling without subsequent striking against object, initial encounter: Secondary | ICD-10-CM | POA: Insufficient documentation

## 2014-01-24 DIAGNOSIS — J90 Pleural effusion, not elsewhere classified: Secondary | ICD-10-CM | POA: Diagnosis present

## 2014-01-24 DIAGNOSIS — K7469 Other cirrhosis of liver: Secondary | ICD-10-CM

## 2014-01-24 DIAGNOSIS — K652 Spontaneous bacterial peritonitis: Secondary | ICD-10-CM

## 2014-01-24 DIAGNOSIS — W1839XS Other fall on same level, sequela: Secondary | ICD-10-CM

## 2014-01-24 DIAGNOSIS — J948 Other specified pleural conditions: Secondary | ICD-10-CM

## 2014-01-24 DIAGNOSIS — E119 Type 2 diabetes mellitus without complications: Secondary | ICD-10-CM

## 2014-01-24 DIAGNOSIS — R160 Hepatomegaly, not elsewhere classified: Secondary | ICD-10-CM | POA: Diagnosis present

## 2014-01-24 LAB — COMPREHENSIVE METABOLIC PANEL
ALT: 12 U/L (ref 0–53)
ANION GAP: 12 (ref 5–15)
AST: 33 U/L (ref 0–37)
Albumin: 2 g/dL — ABNORMAL LOW (ref 3.5–5.2)
Alkaline Phosphatase: 160 U/L — ABNORMAL HIGH (ref 39–117)
BUN: 14 mg/dL (ref 6–23)
CO2: 25 meq/L (ref 19–32)
CREATININE: 1.01 mg/dL (ref 0.50–1.35)
Calcium: 8.2 mg/dL — ABNORMAL LOW (ref 8.4–10.5)
Chloride: 95 mEq/L — ABNORMAL LOW (ref 96–112)
GFR calc Af Amer: 79 mL/min — ABNORMAL LOW (ref >90)
GFR, EST NON AFRICAN AMERICAN: 68 mL/min — AB (ref >90)
Glucose, Bld: 162 mg/dL — ABNORMAL HIGH (ref 70–99)
Potassium: 4.2 mEq/L (ref 3.7–5.3)
Sodium: 132 mEq/L — ABNORMAL LOW (ref 137–147)
Total Bilirubin: 2.9 mg/dL — ABNORMAL HIGH (ref 0.3–1.2)
Total Protein: 6.8 g/dL (ref 6.0–8.3)

## 2014-01-24 LAB — CBC WITH DIFFERENTIAL/PLATELET
Basophils Absolute: 0 K/uL (ref 0.0–0.1)
Basophils Relative: 0 % (ref 0–1)
Eosinophils Absolute: 0.1 K/uL (ref 0.0–0.7)
Eosinophils Relative: 1 % (ref 0–5)
HCT: 35.6 % — ABNORMAL LOW (ref 39.0–52.0)
Hemoglobin: 12.3 g/dL — ABNORMAL LOW (ref 13.0–17.0)
Lymphocytes Relative: 14 % (ref 12–46)
Lymphs Abs: 1.8 K/uL (ref 0.7–4.0)
MCH: 34.6 pg — ABNORMAL HIGH (ref 26.0–34.0)
MCHC: 34.6 g/dL (ref 30.0–36.0)
MCV: 100.3 fL — ABNORMAL HIGH (ref 78.0–100.0)
Monocytes Absolute: 1 K/uL (ref 0.1–1.0)
Monocytes Relative: 8 % (ref 3–12)
Neutro Abs: 9.8 K/uL — ABNORMAL HIGH (ref 1.7–7.7)
Neutrophils Relative %: 77 % (ref 43–77)
Platelets: 110 K/uL — ABNORMAL LOW (ref 150–400)
RBC: 3.55 MIL/uL — ABNORMAL LOW (ref 4.22–5.81)
RDW: 15.9 % — ABNORMAL HIGH (ref 11.5–15.5)
WBC: 12.7 K/uL — ABNORMAL HIGH (ref 4.0–10.5)

## 2014-01-24 LAB — HEMATOCRIT, BODY FLUID: Hematocrit, Fluid: 3.5 %

## 2014-01-24 LAB — BODY FLUID CELL COUNT WITH DIFFERENTIAL
Lymphs, Fluid: 41 %
Monocyte-Macrophage-Serous Fluid: 54 % (ref 50–90)
Neutrophil Count, Fluid: 5 % (ref 0–25)
WBC FLUID: 266 uL (ref 0–1000)

## 2014-01-24 LAB — CHOLESTEROL, TOTAL: Cholesterol: 193 mg/dL (ref 0–200)

## 2014-01-24 LAB — GLUCOSE, SEROUS FLUID: Glucose, Fluid: 142 mg/dL

## 2014-01-24 LAB — PROTIME-INR
INR: 1.47 (ref 0.00–1.49)
Prothrombin Time: 18 seconds — ABNORMAL HIGH (ref 11.6–15.2)

## 2014-01-24 LAB — AMMONIA: Ammonia: 30 umol/L (ref 11–60)

## 2014-01-24 LAB — GLUCOSE, CAPILLARY
Glucose-Capillary: 113 mg/dL — ABNORMAL HIGH (ref 70–99)
Glucose-Capillary: 108 mg/dL — ABNORMAL HIGH (ref 70–99)
Glucose-Capillary: 122 mg/dL — ABNORMAL HIGH (ref 70–99)

## 2014-01-24 LAB — CBG MONITORING, ED: Glucose-Capillary: 119 mg/dL — ABNORMAL HIGH (ref 70–99)

## 2014-01-24 LAB — ALBUMIN, FLUID (OTHER): ALBUMIN FL: 0.8 g/dL

## 2014-01-24 LAB — PROTEIN, BODY FLUID: Total protein, fluid: 1.8 g/dL

## 2014-01-24 LAB — CK: Total CK: 27 U/L (ref 7–232)

## 2014-01-24 LAB — LACTATE DEHYDROGENASE, PLEURAL OR PERITONEAL FLUID: LD, Fluid: 85 U/L — ABNORMAL HIGH (ref 3–23)

## 2014-01-24 LAB — HEMOGLOBIN A1C
Hgb A1c MFr Bld: 5.6 % (ref <5.7)
Mean Plasma Glucose: 114 mg/dL (ref <117)

## 2014-01-24 MED ORDER — SODIUM CHLORIDE 0.9 % IJ SOLN: 3.0000 mL | INTRAMUSCULAR | Status: DC | PRN

## 2014-01-24 MED ORDER — SODIUM CHLORIDE 0.9 % IV SOLN: 250.0000 mL | INTRAVENOUS | Status: DC | PRN

## 2014-01-24 MED ORDER — INSULIN ASPART 100 UNIT/ML ~~LOC~~ SOLN: 0.0000 [IU] | Freq: Three times a day (TID) | SUBCUTANEOUS | Status: DC

## 2014-01-24 MED ORDER — INSULIN ASPART 100 UNIT/ML ~~LOC~~ SOLN: 0.0000 [IU] | Freq: Every day | SUBCUTANEOUS | Status: DC

## 2014-01-24 MED ORDER — MORPHINE SULFATE 2 MG/ML IJ SOLN
2.0000 mg | INTRAMUSCULAR | Status: DC | PRN
  Administered 2014-01-24 (×2): 2 mg via INTRAVENOUS
  Filled 2014-01-24 (×2): qty 1

## 2014-01-24 MED ORDER — SODIUM CHLORIDE 0.9 % IV SOLN
INTRAVENOUS | Status: DC
  Administered 2014-01-24: 13:00:00 via INTRAVENOUS

## 2014-01-24 MED ORDER — SODIUM CHLORIDE 0.9 % IJ SOLN
3.0000 mL | Freq: Two times a day (BID) | INTRAMUSCULAR | Status: DC
  Administered 2014-01-24 – 2014-01-25 (×2): 3 mL via INTRAVENOUS

## 2014-01-24 MED ORDER — DOCUSATE SODIUM 100 MG PO CAPS
100.0000 mg | ORAL_CAPSULE | Freq: Two times a day (BID) | ORAL | Status: DC
  Administered 2014-01-24 – 2014-01-25 (×3): 100 mg via ORAL
  Filled 2014-01-24 (×4): qty 1

## 2014-01-24 MED ORDER — INSULIN ASPART 100 UNIT/ML ~~LOC~~ SOLN: 0.0000 [IU] | SUBCUTANEOUS | Status: DC

## 2014-01-24 MED ORDER — ALBUTEROL SULFATE (2.5 MG/3ML) 0.083% IN NEBU: 2.5000 mg | INHALATION_SOLUTION | RESPIRATORY_TRACT | Status: DC | PRN

## 2014-01-24 MED ORDER — ONDANSETRON HCL 4 MG/2ML IJ SOLN
4.0000 mg | Freq: Four times a day (QID) | INTRAMUSCULAR | Status: DC | PRN
Start: 2014-01-24 — End: 2014-01-25

## 2014-01-24 MED ORDER — PANTOPRAZOLE SODIUM 40 MG PO TBEC
40.0000 mg | DELAYED_RELEASE_TABLET | Freq: Two times a day (BID) | ORAL | Status: DC
  Administered 2014-01-24 – 2014-01-25 (×2): 40 mg via ORAL
  Filled 2014-01-24 (×3): qty 1

## 2014-01-24 MED ORDER — ONDANSETRON HCL 4 MG PO TABS: 4.0000 mg | ORAL_TABLET | Freq: Four times a day (QID) | ORAL | Status: DC | PRN

## 2014-01-24 MED ORDER — FUROSEMIDE 20 MG PO TABS
20.0000 mg | ORAL_TABLET | Freq: Every day | ORAL | Status: DC
  Filled 2014-01-24 (×2): qty 1

## 2014-01-24 MED ORDER — TRAZODONE HCL 50 MG PO TABS
50.0000 mg | ORAL_TABLET | Freq: Every day | ORAL | Status: DC
  Administered 2014-01-24: 50 mg via ORAL
  Filled 2014-01-24 (×2): qty 1

## 2014-01-24 MED ORDER — IOHEXOL 300 MG/ML  SOLN: 100.0000 mL | Freq: Once | INTRAMUSCULAR | Status: DC | PRN

## 2014-01-24 MED ORDER — NADOLOL 40 MG PO TABS
40.0000 mg | ORAL_TABLET | Freq: Every morning | ORAL | Status: DC
  Administered 2014-01-24 – 2014-01-25 (×2): 40 mg via ORAL
  Filled 2014-01-24 (×2): qty 1

## 2014-01-24 MED ORDER — IOHEXOL 300 MG/ML  SOLN
80.0000 mL | Freq: Once | INTRAMUSCULAR | Status: AC | PRN
  Administered 2014-01-24: 80 mL via INTRAVENOUS

## 2014-01-24 MED ORDER — LACTULOSE 10 GM/15ML PO SOLN
20.0000 g | Freq: Every day | ORAL | Status: DC
  Administered 2014-01-25: 20 g via ORAL
  Filled 2014-01-24: qty 30

## 2014-01-24 MED ORDER — SPIRONOLACTONE 50 MG PO TABS
50.0000 mg | ORAL_TABLET | Freq: Every morning | ORAL | Status: DC
  Administered 2014-01-24: 50 mg via ORAL
  Filled 2014-01-24 (×2): qty 1

## 2014-01-24 MED ORDER — PANTOPRAZOLE SODIUM 40 MG PO TBEC: 40.0000 mg | DELAYED_RELEASE_TABLET | Freq: Every day | ORAL | Status: DC

## 2014-01-24 NOTE — ED Notes (Signed)
Patient back from CT and remains in NAD 

## 2014-01-24 NOTE — Consult Note (Signed)
Reason for Consult:cholelithiasis  Referring Physician: Dr. Janece Canterbury   HPI:  Alex Wise is a 80 year old male with a history of cryptogenic cirrhosis, DM, HTN, esophageal varices with bleeding, MI, CKD, bacteremia with hospitalization 6/15 who presented to [facility] following a fall at home.  The patient reports he slipped and fell.  He denies LOC.  His work up showed multiple rib fractures, pleural effusion and a possible liver mass on a CT chest.  This was followed by a CT of abdomen and pelvis which revealed a dilated and tortuous cystic duct and gallbladder containing gallstones previously seen on imaging.  We have therefore been asked to evaluate the patient.  He reports losing around 60lbs in the last month and poor appetite.  He denies any abdominal pain, nausea or vomiting.  He simply has not appetite.  His white count has increased from 5 to 12.7.    Past Medical History  Diagnosis Date  . Hypertension   . Cirrhosis of liver 03/28/2012  . Esophageal varices with bleeding(456.0) 03/27/2012  . Myocardial infarction     x 3  . Portal hypertensive gastropathy   . Chronic kidney disease     mild, no nephrologist  . 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 Diabetes mellitus     Past Surgical History  Procedure Laterality Date  . Esophagogastroduodenoscopy  03/27/2012    Procedure: ESOPHAGOGASTRODUODENOSCOPY (EGD);  Surgeon: Inda Castle, MD;  Location: [facility];  Service: Endoscopy;  Laterality: N/A;  bedside  . Esophagogastroduodenoscopy  04/19/2012    Procedure: ESOPHAGOGASTRODUODENOSCOPY (EGD);  Surgeon: Inda Castle, MD;  Location: [facility];  Service: Endoscopy;  Laterality: N/A;  . Esophageal banding  04/19/2012    Procedure: ESOPHAGEAL BANDING;  Surgeon: Inda Castle, MD;  Location: [facility];  Service: Endoscopy;  Laterality: N/A;  . Esophagogastroduodenoscopy N/A 05/22/2012    Procedure: ESOPHAGOGASTRODUODENOSCOPY (EGD);  Surgeon: Inda Castle, MD;  Location: [facility];  Service: Endoscopy;  Laterality: N/A;  . Esophageal banding N/A 05/22/2012    Procedure: ESOPHAGEAL BANDING;  Surgeon: Inda Castle, MD;  Location: [facility];  Service: Endoscopy;  Laterality: N/A;  . For cirrhosis  july 2015    fluid removed for cirrhosis  . Coronary artery bypass graft  1992    x 7    Family History  Problem Relation Age of Onset  . Lymphoma Brother   . Lung cancer Father   . Ovarian cancer Sister   . Heart attack Mother     Social History:  reports that he quit smoking about 31 years ago. His smoking use included Cigarettes. He has a 2 pack-year smoking history. He has never used smokeless tobacco. He reports that he does not drink alcohol or use illicit drugs.  Allergies:  Allergies  Allergen Reactions  . Codeine Nausea And Vomiting    dizziness    Medications:  Scheduled Meds: . docusate sodium  100 mg Oral BID  . furosemide  20 mg Oral Daily  . insulin aspart  0-9 Units Subcutaneous 6 times per day  . lactulose  20 g Oral Daily  . nadolol  40 mg Oral q morning - 10a  . pantoprazole  40 mg Oral BID  . sodium chloride  3 mL Intravenous Q12H  . spironolactone  50 mg Oral q morning - 10a  . traZODone  50 mg Oral QHS   Continuous Infusions: . sodium chloride     PRN  Meds:.sodium chloride, albuterol, morphine injection, ondansetron (ZOFRAN) IV, ondansetron, sodium chloride   Results for orders placed during the hospital encounter of 01/23/14 (from the past 48 hour(s))  CBC WITH DIFFERENTIAL     Status: Abnormal   Collection Time    01/24/14  2:06 AM      Result Value Ref Range   WBC 12.7 (*) 4.0 - 10.5 K/uL   RBC 3.55 (*) 4.22 - 5.81 MIL/uL   Hemoglobin 12.3 (*) 13.0 - 17.0 g/dL   HCT 35.6 (*) 39.0 - 52.0 %   MCV 100.3 (*) 78.0 - 100.0 fL   MCH 34.6 (*) 26.0 - 34.0 pg   MCHC 34.6  30.0 - 36.0 g/dL   RDW 15.9 (*) 11.5 - 15.5 %   Platelets 110 (*) 150 - 400 K/uL   Comment: SPECIMEN CHECKED FOR CLOTS      PLATELET COUNT CONFIRMED BY SMEAR   Neutrophils Relative % 77  43 - 77 %   Lymphocytes Relative 14  12 - 46 %   Monocytes Relative 8  3 - 12 %   Eosinophils Relative 1  0 - 5 %   Basophils Relative 0  0 - 1 %   Neutro Abs 9.8 (*) 1.7 - 7.7 K/uL   Lymphs Abs 1.8  0.7 - 4.0 K/uL   Monocytes Absolute 1.0  0.1 - 1.0 K/uL   Eosinophils Absolute 0.1  0.0 - 0.7 K/uL   Basophils Absolute 0.0  0.0 - 0.1 K/uL   Smear Review MORPHOLOGY UNREMARKABLE    COMPREHENSIVE METABOLIC PANEL     Status: Abnormal   Collection Time    01/24/14  2:06 AM      Result Value Ref Range   Sodium 132 (*) 137 - 147 mEq/L   Potassium 4.2  3.7 - 5.3 mEq/L   Chloride 95 (*) 96 - 112 mEq/L   CO2 25  19 - 32 mEq/L   Glucose, Bld 162 (*) 70 - 99 mg/dL   BUN 14  6 - 23 mg/dL   Creatinine, Ser 1.01  0.50 - 1.35 mg/dL   Calcium 8.2 (*) 8.4 - 10.5 mg/dL   Total Protein 6.8  6.0 - 8.3 g/dL   Albumin 2.0 (*) 3.5 - 5.2 g/dL   AST 33  0 - 37 U/L   ALT 12  0 - 53 U/L   Alkaline Phosphatase 160 (*) 39 - 117 U/L   Total Bilirubin 2.9 (*) 0.3 - 1.2 mg/dL   GFR calc non Af Amer 68 (*) >90 mL/min   GFR calc Af Amer 79 (*) >90 mL/min   Comment: (NOTE)     The eGFR has been calculated using the CKD EPI equation.     This calculation has not been validated in all clinical situations.     eGFR's persistently <90 mL/min signify possible Chronic Kidney     Disease.   Anion gap 12  5 - 15  AMMONIA     Status: None   Collection Time    01/24/14  2:06 AM      Result Value Ref Range   Ammonia 30  11 - 60 umol/L  CK     Status: None   Collection Time    01/24/14  2:06 AM      Result Value Ref Range   Total CK 27  7 - 232 U/L  PROTIME-INR     Status: Abnormal   Collection Time    01/24/14  2:06 AM  Result Value Ref Range   Prothrombin Time 18.0 (*) 11.6 - 15.2 seconds   INR 1.47  0.00 - 1.49  CHOLESTEROL, TOTAL     Status: None   Collection Time    01/24/14  2:06 AM      Result Value Ref Range   Cholesterol 193  0 -  200 mg/dL   Comment: Performed at [facility], ED     Status: Abnormal   Collection Time    01/24/14  8:54 AM      Result Value Ref Range   Glucose-Capillary 119 (*) 70 - 99 mg/dL  GLUCOSE, CAPILLARY     Status: Abnormal   Collection Time    01/24/14 11:53 AM      Result Value Ref Range   Glucose-Capillary 113 (*) 70 - 99 mg/dL    Dg Ribs Unilateral W/chest Left  01/24/2014   CLINICAL DATA:  Patient fell 3 days ago. Severe upper and lower back pain. Left posterior rib pain.  EXAM: LEFT RIBS AND CHEST - 3+ VIEW  COMPARISON:  08/02/2005  FINDINGS: There is a moderate size left pleural effusion with infiltration throughout the left lung. Shallow inspiration. Normal heart size and pulmonary vascularity. Postoperative change in the mediastinum. No pneumothorax identified.  Multiple left rib fractures are identified, appearing acute. Fractures involve the lateral left sixth, seventh, and eighth ribs.  IMPRESSION: Multiple acute left rib fractures with associated left pleural effusion and atelectasis or infiltration in the left lung.   Electronically Signed   By: Lucienne Capers M.D.   On: 01/24/2014 03:07   Dg Thoracic Spine W/swimmers  01/24/2014   CLINICAL DATA:  [place] 3 days ago with severe upper and lower left Back pain.  EXAM: THORACIC SPINE - 2 VIEW + SWIMMERS  COMPARISON:  Chest 08/02/2005  FINDINGS: Degenerative changes throughout the thoracic spine. No vertebral compression deformities. No paraspinal soft tissue swelling.  IMPRESSION: Degenerative changes in the thoracic spine. No displaced fractures identified.   Electronically Signed   By: Lucienne Capers M.D.   On: 01/24/2014 03:08   Dg Lumbar Spine Complete  01/24/2014   CLINICAL DATA:  Patient fell 3 days ago with severe lower back pain.  EXAM: LUMBAR SPINE - COMPLETE 4+ VIEW  COMPARISON:  None.  FINDINGS: Degenerative changes throughout the lumbar spine. Narrowed lumbar interspaces and associated endplate  hypertrophic changes. Normal alignment of the facet joints. Normal alignment of the lumbar vertebrae. No vertebral compression deformities. Vascular calcifications in the abdominal aorta. Calcification in the right upper quadrant consistent with gallstone. Gas-filled bowel loops without distention, suggesting mild ileus.  IMPRESSION: Degenerative changes in the lumbar spine. No displaced acute fractures demonstrated.   Electronically Signed   By: Lucienne Capers M.D.   On: 01/24/2014 03:09   Ct Head Wo Contrast  01/24/2014   CLINICAL DATA:  Patient fell 3 days ago, striking head. No loss of consciousness.  EXAM: CT HEAD WITHOUT CONTRAST  CT CERVICAL SPINE WITHOUT CONTRAST  TECHNIQUE: Multidetector CT imaging of the head and cervical spine was performed following the standard protocol without intravenous contrast. Multiplanar CT image reconstructions of the cervical spine were also generated.  COMPARISON:  07/20/2011  FINDINGS: CT HEAD FINDINGS  Diffuse cerebral atrophy. Ventricular dilatation consistent with central atrophy. Low-attenuation changes in the deep white matter consistent with small vessel ischemia. Old white matter infarcts in the right anterior frontal region. Vascular calcifications. No mass effect or midline shift. No abnormal extra-axial fluid collections.  Gray-white matter junctions are distinct. Basal cisterns are not effaced. No evidence of acute intracranial hemorrhage. No depressed skull fractures. Visualized paranasal sinuses and mastoid air cells are not opacified.  CT CERVICAL SPINE FINDINGS  Diffuse bone demineralization. Normal alignment of the cervical vertebrae. Diffuse degenerative changes throughout the cervical spine with narrowed cervical interspaces and endplate hypertrophic changes. Prominent posterior disc osteophyte complex at C3-4. Normal alignment of the facet joints. Degenerative changes throughout the facet joints. No vertebral compression deformities. No prevertebral  soft tissue swelling. No focal bone lesion or bone destruction. Vascular calcifications in the cervical carotid arteries. Soft tissues are unremarkable. Large left pleural effusion. Fibrosis or edema demonstrated in the lung apices.  IMPRESSION: No acute intracranial abnormalities. Diffuse cerebral atrophy and small vessel ischemic changes. 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  Diffuse degenerative changes and demineralization in the cervical spine. Normal alignment. No displaced fractures identified.   Electronically Signed   By: Lucienne Capers M.D.   On: 01/24/2014 03:14   Ct Chest Wo Contrast  01/24/2014   CLINICAL DATA:  Patient fell 3 days ago.  Left rib pain.  EXAM: CT CHEST WITHOUT CONTRAST  TECHNIQUE: Multidetector CT imaging of the chest was performed following the standard protocol without IV contrast.  COMPARISON:  01/14/2005  FINDINGS: Large left pleural effusion with consolidation or atelectasis throughout the left lung. Small right pleural effusion with right basilar atelectasis. Appears to be diffuse underlying peripheral fibrosis consistent with chronic usual interstitial pneumonitis. The heart size is normal. Normal caliber thoracic aorta. Postoperative changes with bypass grafting. Native Coronary artery and aortic calcification. No significant lymphadenopathy in the chest. Airways appear patent. Multiple left rib fractures with rib fractures demonstrated in the left sixth, seventh, eighth, and ninth ribs. No pneumothorax. Degenerative changes in the thoracic spine. No vertebral compression deformities.  Included portions of the upper abdominal organs demonstrate what appears to be a low-attenuation mass in the liver, measuring about 7.7 cm in diameter. This was not present on the previous study. It is incompletely included on the current study. Developing mass or metastatic disease is suspected. There is diffuse upper abdominal ascites which is also new since the previous study.   IMPRESSION: Multiple left rib fractures with large left pleural effusion and atelectasis/ consolidation throughout the left lung. Small right pleural effusion with mild basilar atelectasis. Underlying interstitial fibrosis in the lungs. No pneumothorax. Incidental note of what appears to be a large mass in the liver. Primary or secondary malignancy is suspected. Upper abdominal ascites is also demonstrated.   Electronically Signed   By: Lucienne Capers M.D.   On: 01/24/2014 05:46   Ct Cervical Spine Wo Contrast  01/24/2014   CLINICAL DATA:  Patient fell 3 days ago, striking head. No loss of consciousness.  EXAM: CT HEAD WITHOUT CONTRAST  CT CERVICAL SPINE WITHOUT CONTRAST  TECHNIQUE: Multidetector CT imaging of the head and cervical spine was performed following the standard protocol without intravenous contrast. Multiplanar CT image reconstructions of the cervical spine were also generated.  COMPARISON:  07/20/2011  FINDINGS: CT HEAD FINDINGS  Diffuse cerebral atrophy. Ventricular dilatation consistent with central atrophy. Low-attenuation changes in the deep white matter consistent with small vessel ischemia. Old white matter infarcts in the right anterior frontal region. Vascular calcifications. No mass effect or midline shift. No abnormal extra-axial fluid collections. Gray-white matter junctions are distinct. Basal cisterns are not effaced. No evidence of acute intracranial hemorrhage. No depressed skull fractures. Visualized paranasal sinuses and mastoid  air cells are not opacified.  CT CERVICAL SPINE FINDINGS  Diffuse bone demineralization. Normal alignment of the cervical vertebrae. Diffuse degenerative changes throughout the cervical spine with narrowed cervical interspaces and endplate hypertrophic changes. Prominent posterior disc osteophyte complex at C3-4. Normal alignment of the facet joints. Degenerative changes throughout the facet joints. No vertebral compression deformities. No prevertebral  soft tissue swelling. No focal bone lesion or bone destruction. Vascular calcifications in the cervical carotid arteries. Soft tissues are unremarkable. Large left pleural effusion. Fibrosis or edema demonstrated in the lung apices.  IMPRESSION: No acute intracranial abnormalities. Diffuse cerebral atrophy and small vessel ischemic changes. 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  Diffuse degenerative changes and demineralization in the cervical spine. Normal alignment. No displaced fractures identified.   Electronically Signed   By: Lucienne Capers M.D.   On: 01/24/2014 03:14   Ct Abdomen Pelvis W Contrast  01/24/2014   CLINICAL DATA:  Hepatic cirrhosis. Esophageal varices with bleeding previously. Prior myocardial infarction. Portal hypertension causing gas trough of the. Chronic kidney disease. The patient fell in the bathroom 3 days ago and was not able to get up until helped the next day. Continued chest pain after the fall. A recent chest CT showed multiple left rib fractures, large left pleural effusion, and a possible mass in the liver. We are further working up that mass.  EXAM: CT ABDOMEN AND PELVIS WITH CONTRAST  TECHNIQUE: Multidetector CT imaging of the abdomen and pelvis was performed using the standard protocol following bolus administration of intravenous contrast.  CONTRAST:  54m OMNIPAQUE IOHEXOL 300 MG/ML  SOLN  COMPARISON:  01/24/2014; 03/28/2012  FINDINGS: Lower chest: Left posterior eighth and ninth rib fractures with large left pleural effusion and atelectasis of the left lower lobe noted. Dependent density in the left pleural space suggesting hemothorax. Relatively nondisplaced left anterior rib fractures are also noted. Small right pleural effusion with passive atelectasis. Aortic and mitral calcification along with coronary artery atherosclerotic calcification. Peripheral interstitial accentuation in the lung bases.  Hepatobiliary: As best I can tell the cystic "mass" along  the right hepatic lobe actually represents a distended and tortuous gallbladder and cystic duct. It appears to contain the previously seen gallstones along its inferior portions. The common bile duct does not appear dilated. However, there is thrombus in the superior mesenteric vein, portal vein, and portal venous tributaries. No hepatic vein thrombosis is observed. Hepatic cirrhosis noted. Extensive portosystemic varices.  Pancreas: Intact  Spleen: Small peripheral laceration in the upper margin of the spleen, images 24-27 of series 3.  Adrenals/Urinary Tract: Left kidney lower pole benign appearing cyst, 2.0 by 1.7 cm. The ureters are difficult to follow due to all of the surrounding edema but no calculi are visualized. Right bladder cellule.  Stomach/Bowel: Antral wall thickening in the stomach. Wall thickening in the bowel probably due to hypoalbuminemia.  Vascular/Lymphatic: Prominent aortoiliac atherosclerosis. Reactive porta hepatis lymph nodes.  Reproductive: Calcifications along the margins of the central zone of the prostate gland.  Other: Extensive ascites and mesenteric edema.  Musculoskeletal: Lower lumbar spondylosis. Rib findings as detailed above in the chest section.  IMPRESSION: 1. The cystic mass appears to represent a dilated and tortuous cystic duct and gallbladder and contains the gallstones which were previously clearly within the gallbladder. I doubt that this is a choledochal cyst since there is no choledochal cyst back on 03/28/2012. Occasionally a cystic lymphangioma can occur adjacent to the gallbladder in give a similar appearance. The gallbladder is  clearly abnormal and distended with infolding is causing a septated appearance, and in combination with the patient's acute posttraumatic clinical problems and comorbidities, the relative merits of gallbladder removal should be considered. 2. Small peripheral laceration along the superior spleen, grade 1. 3. 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 4. Cirrhosis with extensive mesenteric edema and ascites. Extensive portosystemic varices. 5. Left lower anterior and posterior rib fractures with large left pleural effusion. Small right pleural effusion. 6. Gastric and bowel wall thickening probably from hypoalbuminemia. 7. Atherosclerosis. 8. Extensive   Electronically Signed   By: Sherryl Barters M.D.   On: 01/24/2014 09:18    Review of Systems  All other systems reviewed and are negative.  Blood pressure 109/70, pulse 86, temperature 97.8 F (36.6 C), temperature source Oral, resp. rate 20, height 5' 5"  (1.651 m), weight 136 lb 11 oz (62 kg), SpO2 98.00%. Physical Exam  Constitutional: He is oriented to person, place, and time. He appears well-developed and well-nourished. No distress.  HENT:  Head: Normocephalic and atraumatic.  Eyes: No scleral icterus.  Cardiovascular: Normal rate, regular rhythm and intact distal pulses.  Exam reveals no gallop and no friction rub.   No murmur heard. Respiratory: Effort normal.  Diminished breath sounds  GI: Soft. Bowel sounds are normal. He exhibits no distension and no mass. There is no tenderness. There is no rebound and no guarding.  Mild ascites   Musculoskeletal: Normal range of motion. He exhibits no edema and no tenderness.  Neurological: He is alert and oriented to person, place, and time.  Skin: Skin is warm and dry. No rash noted. He is not diaphoretic. No erythema. There is pallor.  Psychiatric: He has a normal mood and affect. His behavior is normal. Judgment and thought content normal.    Assessment/Plan: Fall Cryptogenic cirrhosis-GI following.   Left sided Rib fractures-aggressive pulmonary toilet Pleural effusion-pulmonary on board, planning for a thoracentesis by IR.  Thrombocytopenia-2/2 liver disease Grade I spleen laceration-monitor h&h, may be mobiliized Distended gallbladder and cholelithiasis-the patient is completely asymptomatic and has a  benign abdominal exam.  The CT findings are likely from his liver disease.  He is a childs B and wound be a high surgical risk.  Therefore, no surgical indications.  May resume diet.  Further management per primary team.  Will sign off.  Please contact [facility] with questions or concerns.    Tyleigh Mahn ANP-BC 01/24/2014, 12:36 PM

## 2014-01-24 NOTE — Care Management Note (Signed)
CARE MANAGEMENT NOTE 01/24/2014  Patient:  Alex Wise, Alex Wise   Account Number:  [id number]  Date Initiated:  01/24/2014  Documentation initiated by:  Sandford Craze  Subjective/Objective Assessment:   80 yo admitted with fall/liver mass/pleural effusion     Action/Plan:   From home alone.  Pt gets meals on wheels.   Anticipated DC Date:  01/24/2014   Anticipated DC Plan:  HOME W HOME HEALTH SERVICES      DC Planning Services  CM consult      Choice offered to / List presented to:             Status of service:  In process, will continue to follow Medicare Important Message given?   (If response is "NO", the following Medicare IM given date fields will be blank) Date Medicare IM given:   Medicare IM given by:   Date Additional Medicare IM given:   Additional Medicare IM given by:    Discharge Disposition:    Per UR Regulation:  Reviewed for med. necessity/level of care/duration of stay  If discussed at Long Length of Stay Meetings, dates discussed:    Comments:  01/24/14 Sandford Craze RN,BSN,NCM [phone] Chart reviewed and CM following for DC needs.  Pt could potentially benefit from PT/OT consult to prepare for safe DC plan.

## 2014-01-24 NOTE — ED Notes (Signed)
Patient with episode of urinary incontinence New brief applied and patient repositioned in bed Awaiting CT scan

## 2014-01-24 NOTE — Telephone Encounter (Signed)
FYI-spoke with the patient. He has broken ribs and he will be in the hospital for at least 3 days. He wants to call us and reschedule the EGD once he is out of the hospital.

## 2014-01-24 NOTE — Telephone Encounter (Signed)
He can reschedule when he feels better

## 2014-01-24 NOTE — ED Notes (Signed)
Patient incontinent of urine- patient changed by myself and Ted Mcalpine

## 2014-01-24 NOTE — Consult Note (Signed)
Agree with in the NP note above. He has cholelithiasis and an unusual appearing gallbladder but I suspect is just secondary to nodular cirrhosis. He has no symptoms or physical exam findings to suggest gallbladder disease and would obviously be at high risk due to cirrhosis and recent rib fractures and pleural effusions. I do not see an indication for surgery.

## 2014-01-24 NOTE — ED Notes (Signed)
Assumed care of patient Patient noted to be placed for admission Patient resting in position of comfort with eyes closed RR WNL--even and unlabored with equal rise and fall of chest Patient in NAD Side rails up, call bell in reach

## 2014-01-24 NOTE — ED Notes (Signed)
Patient transported to CT 

## 2014-01-24 NOTE — ED Notes (Signed)
Patient transported to CT via stretcher Patient in NAD upon leaving for testing 

## 2014-01-24 NOTE — H&P (Signed)
PCP:  Dorrene German, MD    Chief Complaint:  fall  HPI: Alex Wise is a 80 y.o. male   has a past medical history of Diabetes mellitus; Hypertension; Cirrhosis of liver (03/28/2012); Esophageal varices with bleeding(456.0) (03/27/2012); Myocardial infarction; Portal hypertensive gastropathy; Chronic kidney disease; and Stroke (1992).   Presented with  Patient had a fall in the bathroom 3 days ago. He was not able to get up and was on the floor until next morning until "meals on wheels" arrived. He refused hospital care at the time. He continued to have chest pain after the fall. His family had convince him to to come to [facility] ER. He has hx of non-alcoholic cirrhosis with hx of variceal bleeding followed by Dr. Arlyce Dice.  CT of the chest was done showing multiple rib fractures Left>right pleural effusion and liver mass measuring 7 cm. Patient denies any SOB, no fever or chills.  Hospitalist was called for admission for multiple rib fractures, pleural effusion, and possible Liver mass.   Review of Systems:    Pertinent positives include:  chest pain,  Constitutional:  No weight loss, night sweats, Fevers, chills, fatigue, weight loss  HEENT:  No headaches, Difficulty swallowing,Tooth/dental problems,Sore throat,  No sneezing, itching, ear ache, nasal congestion, post nasal drip,  Cardio-vascular:  No  Orthopnea, PND, anasarca, dizziness, palpitations.no Bilateral lower extremity swelling  GI:  No heartburn, indigestion, abdominal pain, nausea, vomiting, diarrhea, change in bowel habits, loss of appetite, melena, blood in stool, hematemesis Resp:  no shortness of breath at rest. No dyspnea on exertion, No excess mucus, no productive cough, No non-productive cough, No coughing up of blood.No change in color of mucus.No wheezing. Skin:  no rash or lesions. No jaundice GU:  no dysuria, change in color of urine, no urgency or frequency. No straining to urinate.  No flank pain.    Musculoskeletal:  No joint pain or no joint swelling. No decreased range of motion. No back pain.  Psych:  No change in mood or affect. No depression or anxiety. No memory loss.  Neuro: no localizing neurological complaints, no tingling, no weakness, no double vision, no gait abnormality, no slurred speech, no confusion  Otherwise ROS are negative except for above, 10 systems were reviewed  Past Medical History: Past Medical History  Diagnosis Date  . Diabetes mellitus   . Hypertension   . Cirrhosis of liver 03/28/2012  . Esophageal varices with bleeding(456.0) 03/27/2012  . Myocardial infarction     x 3  . Portal hypertensive gastropathy   . Chronic kidney disease     mild, no nephrologist  . 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 Esophagogastroduodenoscopy  03/27/2012    Procedure: ESOPHAGOGASTRODUODENOSCOPY (EGD);  Surgeon: Louis Meckel, MD;  Location: [facility];  Service: Endoscopy;  Laterality: N/A;  bedside  . Esophagogastroduodenoscopy  04/19/2012    Procedure: ESOPHAGOGASTRODUODENOSCOPY (EGD);  Surgeon: Louis Meckel, MD;  Location: [facility];  Service: Endoscopy;  Laterality: N/A;  . Esophageal banding  04/19/2012    Procedure: ESOPHAGEAL BANDING;  Surgeon: Louis Meckel, MD;  Location: [facility];  Service: Endoscopy;  Laterality: N/A;  . Esophagogastroduodenoscopy N/A 05/22/2012    Procedure: ESOPHAGOGASTRODUODENOSCOPY (EGD);  Surgeon: Louis Meckel, MD;  Location: [facility];  Service: Endoscopy;  Laterality: N/A;  . Esophageal banding N/A 05/22/2012    Procedure: ESOPHAGEAL BANDING;  Surgeon: Louis Meckel, MD;  Location: [facility];  Service: Endoscopy;  Laterality: N/A;  . For cirrhosis  july 2015    fluid removed for cirrhosis  . Coronary artery bypass graft  1992    x 7     Medications: Prior to Admission medications   Medication Sig Start Date End Date Taking? Authorizing  Provider  furosemide (LASIX) 20 MG tablet Take 20 mg by mouth every morning.   Yes Historical Provider, MD  insulin aspart (NOVOLOG) 100 UNIT/ML injection Inject 0-9 Units into the skin 3 (three) times daily as needed for high blood sugar.    Yes Historical Provider, MD  lactulose (CHRONULAC) 10 GM/15ML solution Take 30 mLs (20 g total) by mouth daily. 09/06/13  Yes [name], MD  nadolol (CORGARD) 40 MG tablet Take 40 mg by mouth every morning.   Yes Historical Provider, MD  pantoprazole (PROTONIX) 40 MG tablet Take 40 mg by mouth 2 (two) times daily.   Yes Historical Provider, MD  spironolactone (ALDACTONE) 50 MG tablet Take 50 mg by mouth every morning.   Yes Historical Provider, MD  traZODone (DESYREL) 50 MG tablet Take 50 mg by mouth at bedtime.   Yes Historical Provider, MD    Allergies:   Allergies  Allergen Reactions  . Codeine Nausea And Vomiting    dizziness    Social History:  Ambulatory  Walker or  cane,   Lives at home alone,     reports that he quit smoking about 31 years ago. His smoking use included Cigarettes. He has a 2 pack-year smoking history. He has never used smokeless tobacco. He reports that he does not drink alcohol or use illicit drugs.    Family History: family history includes Heart attack in his mother; Lung cancer in his father; Lymphoma in his brother; Ovarian cancer in his sister.    Physical Exam: Patient Vitals for the past 24 hrs:  BP Temp Temp src Pulse Resp SpO2  01/24/14 0541 111/66 mmHg - - 90 18 94 %  01/24/14 0530 - - - 87 16 94 %  01/24/14 0500 - - - 86 16 92 %  01/24/14 0445 - - - 86 29 94 %  01/24/14 0430 - - - 87 13 93 %  01/24/14 0345 - - - 86 29 92 %  01/24/14 0330 113/66 mmHg - - 88 - 93 %  01/24/14 0329 113/66 mmHg - - 88 15 93 %  01/23/14 2342 123/59 mmHg 98.7 F (37.1 C) Oral 91 20 95 %    1. General:  in No Acute distress 2. Psychological: Alert and  Oriented 3. Head/ENT:     Dry Mucous Membranes                           Head Non traumatic, neck supple                          Normal   Dentition 4. SKIN:  decreased Skin turgor,  Skin clean Dry and intact no rash 5. Heart: Regular rate and rhythm no Murmur, Rub or gallop 6. Lungs:  no wheezes occasional crackles, no breath souds on the left 7. Abdomen: Soft, non-tender, Non distended, hepatomegaly noted 8. Lower extremities: no clubbing, cyanosis, or edema 9. Neurologically Grossly intact, moving all 4 extremities equally , no asterexis 10. MSK: Normal range of motion  body mass index is unknown because there is no weight on  file.   Labs on Admission:   Recent Labs  01/24/14 0206  NA 132*  K 4.2  CL 95*  CO2 25  GLUCOSE 162*  BUN 14  CREATININE 1.01  CALCIUM 8.2*    Recent Labs  01/24/14 0206  AST 33  ALT 12  ALKPHOS 160*  BILITOT 2.9*  PROT 6.8  ALBUMIN 2.0*   No results found for this basename: LIPASE, AMYLASE,  in the last 72 hours  Recent Labs  01/24/14 0206  WBC 12.7*  NEUTROABS 9.8*  HGB 12.3*  HCT 35.6*  MCV 100.3*  PLT 110*    Recent Labs  01/24/14 0206  CKTOTAL 27   No results found for this basename: TSH, T4TOTAL, FREET3, T3FREE, THYROIDAB,  in the last 72 hours No results found for this basename: VITAMINB12, FOLATE, FERRITIN, TIBC, IRON, RETICCTPCT,  in the last 72 hours Lab Results  Component Value Date   HGBA1C 6.6* 08/31/2013    The CrCl is unknown because both a height and weight (above a minimum accepted value) are required for this calculation. ABG No results found for this basename: phart, pco2, po2, hco3, tco2, acidbasedef, o2sat     No results found for this basename: DDIMER    BNP (last 3 results) No results found for this basename: PROBNP,  in the last 8760 hours  There were no vitals filed for this visit.   Cultures:    Component Value Date/Time   SDES ASCITIC 09/05/2013 1013   SPECREQUEST Normal 09/05/2013 1013   CULT  Value: NO GROWTH 3 DAYS Performed at [facility]  09/05/2013 1013   REPTSTATUS 09/08/2013 FINAL 09/05/2013 1013    Radiological Exams on Admission: Dg Ribs Unilateral W/chest Left  01/24/2014   CLINICAL DATA:  Patient fell 3 days ago. Severe upper and lower back pain. Left posterior rib pain.  EXAM: LEFT RIBS AND CHEST - 3+ VIEW  COMPARISON:  08/02/2005  FINDINGS: There is a moderate size left pleural effusion with infiltration throughout the left lung. Shallow inspiration. Normal heart size and pulmonary vascularity. Postoperative change in the mediastinum. No pneumothorax identified.  Multiple left rib fractures are identified, appearing acute. Fractures involve the lateral left sixth, seventh, and eighth ribs.  IMPRESSION: Multiple acute left rib fractures with associated left pleural effusion and atelectasis or infiltration in the left lung.   Electronically Signed   By: Burman Nieves M.D.   On: 01/24/2014 03:07   Dg Thoracic Spine W/swimmers  01/24/2014   CLINICAL DATA:  Larey Seat 3 days ago with severe upper and lower left Back pain.  EXAM: THORACIC SPINE - 2 VIEW + SWIMMERS  COMPARISON:  Chest 08/02/2005  FINDINGS: Degenerative changes throughout the thoracic spine. No vertebral compression deformities. No paraspinal soft tissue swelling.  IMPRESSION: Degenerative changes in the thoracic spine. No displaced fractures identified.   Electronically Signed   By: Burman Nieves M.D.   On: 01/24/2014 03:08   Dg Lumbar Spine Complete  01/24/2014   CLINICAL DATA:  Patient fell 3 days ago with severe lower back pain.  EXAM: LUMBAR SPINE - COMPLETE 4+ VIEW  COMPARISON:  None.  FINDINGS: Degenerative changes throughout the lumbar spine. Narrowed lumbar interspaces and associated endplate hypertrophic changes. Normal alignment of the facet joints. Normal alignment of the lumbar vertebrae. No vertebral compression deformities. Vascular calcifications in the abdominal aorta. Calcification in the right upper quadrant consistent with gallstone. Gas-filled bowel  loops without distention, suggesting mild ileus.  IMPRESSION: Degenerative changes in the lumbar  spine. No displaced acute fractures demonstrated.   Electronically Signed   By: Burman Nieves M.D.   On: 01/24/2014 03:09   Ct Head Wo Contrast  01/24/2014   CLINICAL DATA:  Patient fell 3 days ago, striking head. No loss of consciousness.  EXAM: CT HEAD WITHOUT CONTRAST  CT CERVICAL SPINE WITHOUT CONTRAST  TECHNIQUE: Multidetector CT imaging of the head and cervical spine was performed following the standard protocol without intravenous contrast. Multiplanar CT image reconstructions of the cervical spine were also generated.  COMPARISON:  07/20/2011  FINDINGS: CT HEAD FINDINGS  Diffuse cerebral atrophy. Ventricular dilatation consistent with central atrophy. Low-attenuation changes in the deep white matter consistent with small vessel ischemia. Old white matter infarcts in the right anterior frontal region. Vascular calcifications. No mass effect or midline shift. No abnormal extra-axial fluid collections. Gray-white matter junctions are distinct. Basal cisterns are not effaced. No evidence of acute intracranial hemorrhage. No depressed skull fractures. Visualized paranasal sinuses and mastoid air cells are not opacified.  CT CERVICAL SPINE FINDINGS  Diffuse bone demineralization. Normal alignment of the cervical vertebrae. Diffuse degenerative changes throughout the cervical spine with narrowed cervical interspaces and endplate hypertrophic changes. Prominent posterior disc osteophyte complex at C3-4. Normal alignment of the facet joints. Degenerative changes throughout the facet joints. No vertebral compression deformities. No prevertebral soft tissue swelling. No focal bone lesion or bone destruction. Vascular calcifications in the cervical carotid arteries. Soft tissues are unremarkable. Large left pleural effusion. Fibrosis or edema demonstrated in the lung apices.  IMPRESSION: No acute intracranial  abnormalities. Diffuse cerebral atrophy and small vessel ischemic changes. 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  Diffuse degenerative changes and demineralization in the cervical spine. Normal alignment. No displaced fractures identified.   Electronically Signed   By: Burman Nieves M.D.   On: 01/24/2014 03:14   Ct Chest Wo Contrast  01/24/2014   CLINICAL DATA:  Patient fell 3 days ago.  Left rib pain.  EXAM: CT CHEST WITHOUT CONTRAST  TECHNIQUE: Multidetector CT imaging of the chest was performed following the standard protocol without IV contrast.  COMPARISON:  01/14/2005  FINDINGS: Large left pleural effusion with consolidation or atelectasis throughout the left lung. Small right pleural effusion with right basilar atelectasis. Appears to be diffuse underlying peripheral fibrosis consistent with chronic usual interstitial pneumonitis. The heart size is normal. Normal caliber thoracic aorta. Postoperative changes with bypass grafting. Native Coronary artery and aortic calcification. No significant lymphadenopathy in the chest. Airways appear patent. Multiple left rib fractures with rib fractures demonstrated in the left sixth, seventh, eighth, and ninth ribs. No pneumothorax. Degenerative changes in the thoracic spine. No vertebral compression deformities.  Included portions of the upper abdominal organs demonstrate what appears to be a low-attenuation mass in the liver, measuring about 7.7 cm in diameter. This was not present on the previous study. It is incompletely included on the current study. Developing mass or metastatic disease is suspected. There is diffuse upper abdominal ascites which is also new since the previous study.  IMPRESSION: Multiple left rib fractures with large left pleural effusion and atelectasis/ consolidation throughout the left lung. Small right pleural effusion with mild basilar atelectasis. Underlying interstitial fibrosis in the lungs. No pneumothorax. Incidental  note of what appears to be a large mass in the liver. Primary or secondary malignancy is suspected. Upper abdominal ascites is also demonstrated.   Electronically Signed   By: Burman Nieves M.D.   On: 01/24/2014 05:46   Ct Cervical  Spine Wo Contrast  01/24/2014   CLINICAL DATA:  Patient fell 3 days ago, striking head. No loss of consciousness.  EXAM: CT HEAD WITHOUT CONTRAST  CT CERVICAL SPINE WITHOUT CONTRAST  TECHNIQUE: Multidetector CT imaging of the head and cervical spine was performed following the standard protocol without intravenous contrast. Multiplanar CT image reconstructions of the cervical spine were also generated.  COMPARISON:  07/20/2011  FINDINGS: CT HEAD FINDINGS  Diffuse cerebral atrophy. Ventricular dilatation consistent with central atrophy. Low-attenuation changes in the deep white matter consistent with small vessel ischemia. Old white matter infarcts in the right anterior frontal region. Vascular calcifications. No mass effect or midline shift. No abnormal extra-axial fluid collections. Gray-white matter junctions are distinct. Basal cisterns are not effaced. No evidence of acute intracranial hemorrhage. No depressed skull fractures. Visualized paranasal sinuses and mastoid air cells are not opacified.  CT CERVICAL SPINE FINDINGS  Diffuse bone demineralization. Normal alignment of the cervical vertebrae. Diffuse degenerative changes throughout the cervical spine with narrowed cervical interspaces and endplate hypertrophic changes. Prominent posterior disc osteophyte complex at C3-4. Normal alignment of the facet joints. Degenerative changes throughout the facet joints. No vertebral compression deformities. No prevertebral soft tissue swelling. No focal bone lesion or bone destruction. Vascular calcifications in the cervical carotid arteries. Soft tissues are unremarkable. Large left pleural effusion. Fibrosis or edema demonstrated in the lung apices.  IMPRESSION: No acute intracranial  abnormalities. Diffuse cerebral atrophy and small vessel ischemic changes. 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  Diffuse degenerative changes and demineralization in the cervical spine. Normal alignment. No displaced fractures identified.   Electronically Signed   By: Burman NievesWilliam  Stevens M.D.   On: 01/24/2014 03:14    Chart has been reviewed  Assessment/Plan  80 year old gentleman with history of nonalcoholic cirrhosis, diabetes, presents after a fall with left-sided multiple rib fracture, pleural effusion left greater than right. And incidentally was found to have a large tumor mass on CT scan of the chest will need to further evaluation  Present on Admission:  Large pleural effusion on the left associated with recent trauma and rib fractures - patient is currently has no new oxygen requirement minimal shortness of breath diagnostic tap would be beneficial. We'll need to discuss with pulmonology in the morning  . Rib fractures - pain management,  incentive spirometer  . Cirrhosis of liver - will need to discuss with GI in the morning regarding overall prognosis new finding of liver mass  . Thrombocytopenia - likely related to cirrhosis continue to monitor  . Liver mass - will obtain CT scan of abdomen and pelvis to further evaluation, biopsy if patient agrees currently he is uncertain of his goals of care   Prophylaxis: SCD , Protonix  CODE STATUS:  Code status will need to be clarified  Other plan as per orders.  I have spent a total of 55 min on this admission  Briannie Gutierrez 01/24/2014, 6:21 AM  [facility]  Pager [phone]   after 2 AM please page floor coverage PA If 7AM-7PM, please contact the day team taking care of the patient  [email or web address]  Password [email or web address]

## 2014-01-24 NOTE — ED Notes (Signed)
Noam Arutyunyan- son [phone] Faye-girlfriend-[phone]

## 2014-01-24 NOTE — Progress Notes (Signed)
Patient arrived to floor on stretcher, from the ED. Patient in good spirits, no complaint of pain except when he moves. Pt was made NPO pending possible procedure today.

## 2014-01-24 NOTE — ED Notes (Signed)
Pt placed on cardiac monitor 

## 2014-01-24 NOTE — Consult Note (Addendum)
PULMONARY / CRITICAL CARE MEDICINE   Name: Alex Wise MRN: [id number] DOB: May 01, 1931    ADMISSION DATE:  01/23/2014 CONSULTATION DATE:  01/24/14  REFERRING MD :  [facility]  CHIEF COMPLAINT:  LEft pleural effusion   SIGNIFICANT EVENTS: 01/23/2014 - admiot 01/24/14 - consult pulmonary  HISTORY OF PRESENT ILLNESS:  Hx gained from [facility] hospitalist   Patient had a fall in the bathroom 3 days ago. He was not able to get up and was on the floor until next morning until "meals on wheels" arrived. He refused hospital care at the time. He continued to have chest pain after the fall. His family had convince him to to come to [facility] ER. He has hx of non-alcoholic cirrhosis with hx of variceal bleeding followed by Dr. Arlyce Dice. CT of the chest was done showing multiple rib fractures Left>right pleural effusion and liver mass measuring 7 cm. Patient denies any SOB, no fever or chills.  In addition CT chest showed findings of ILD  PAST MEDICAL HISTORY :   has a past medical history of Hypertension; Cirrhosis of liver (03/28/2012); Esophageal varices with bleeding(456.0) (03/27/2012); Myocardial infarction; Portal hypertensive gastropathy; Chronic kidney disease; Stroke (1992); and Diabetes mellitus.  has past surgical history that includes Esophagogastroduodenoscopy (03/27/2012); Esophagogastroduodenoscopy (04/19/2012); esophageal banding (04/19/2012); Esophagogastroduodenoscopy (N/A, 05/22/2012); esophageal banding (N/A, 05/22/2012); for cirrhosis (july 2015); and Coronary artery bypass graft (1992). Prior to Admission medications   Medication Sig Start Date End Date Taking? Authorizing Provider  furosemide (LASIX) 20 MG tablet Take 20 mg by mouth every morning.   Yes Historical Provider, MD  insulin aspart (NOVOLOG) 100 UNIT/ML injection Inject 0-9 Units into the skin 3 (three) times daily as needed for high blood sugar.    Yes Historical Provider, MD  lactulose (CHRONULAC) 10 GM/15ML solution Take 30 mLs  (20 g total) by mouth daily. 09/06/13  Yes [name], MD  nadolol (CORGARD) 40 MG tablet Take 40 mg by mouth every morning.   Yes Historical Provider, MD  pantoprazole (PROTONIX) 40 MG tablet Take 40 mg by mouth 2 (two) times daily.   Yes Historical Provider, MD  spironolactone (ALDACTONE) 50 MG tablet Take 50 mg by mouth every morning.   Yes Historical Provider, MD  traZODone (DESYREL) 50 MG tablet Take 50 mg by mouth at bedtime.   Yes Historical Provider, MD   Allergies  Allergen Reactions  . Codeine Nausea And Vomiting    dizziness    FAMILY HISTORY:  has no family status information on file.  SOCIAL HISTORY:  reports that he quit smoking about 31 years ago. His smoking use included Cigarettes. He has a 2 pack-year smoking history. He has never used smokeless tobacco. He reports that he does not drink alcohol or use illicit drugs.  REVIEW OF SYSTEMS:  Per  HPI is the 11 point ROS, otherwise negative    VITAL SIGNS: Temp:  [97.8 F (36.6 C)-98.7 F (37.1 C)] 97.8 F (36.6 C) (10/22 0915) Pulse Rate:  [83-92] 86 (10/22 0915) Resp:  [13-29] 20 (10/22 0915) BP: (107-123)/(55-70) 109/70 mmHg (10/22 0915) SpO2:  [89 %-98 %] 98 % (10/22 0915) Weight:  [62 kg (136 lb 11 oz)] 62 kg (136 lb 11 oz) (10/22 0915) HEMODYNAMICS:   VENTILATOR SETTINGS:   INTAKE / OUTPUT: No intake or output data in the 24 hours ending 01/24/14 1007  PHYSICAL EXAMINATION: General:  Frail elderly male. Looks well Neuro:  AxXOx3. Speech normal. No liver flap HEENT:  Supple neck Cardiovascular:  Normal heart sounds Lungs:  LEft chest tender to touch with diminished breath sounds and decreased air entry and stony dullness on percussion Abdomen:  Ascites + Musculoskeletal:  No cynaosis. No clubbing Skin:  INtact anteriorly  LABS: PULMONARY No results found for this basename: PHART, PCO2, PCO2ART, PO2, PO2ART, HCO3, TCO2, O2SAT,  in the last 168 hours  CBC  Recent Labs Lab 01/24/14 0206  HGB  12.3*  HCT 35.6*  WBC 12.7*  PLT 110*    COAGULATION  Recent Labs Lab 01/24/14 0206  INR 1.47    CARDIAC  No results found for this basename: TROPONINI,  in the last 168 hours No results found for this basename: PROBNP,  in the last 168 hours   CHEMISTRY  Recent Labs Lab 01/24/14 0206  NA 132*  K 4.2  CL 95*  CO2 25  GLUCOSE 162*  BUN 14  CREATININE 1.01  CALCIUM 8.2*   Estimated Creatinine Clearance: 50.7 ml/min (by C-G formula based on Cr of 1.01).   LIVER  Recent Labs Lab 01/24/14 0206  AST 33  ALT 12  ALKPHOS 160*  BILITOT 2.9*  PROT 6.8  ALBUMIN 2.0*  INR 1.47     INFECTIOUS No results found for this basename: LATICACIDVEN, PROCALCITON,  in the last 168 hours   ENDOCRINE CBG (last 3)   Recent Labs  01/24/14 0854  GLUCAP 119*         IMAGING x48h Dg Ribs Unilateral W/chest Left  01/24/2014   CLINICAL DATA:  Patient fell 3 days ago. Severe upper and lower back pain. Left posterior rib pain.  EXAM: LEFT RIBS AND CHEST - 3+ VIEW  COMPARISON:  08/02/2005  FINDINGS: There is a moderate size left pleural effusion with infiltration throughout the left lung. Shallow inspiration. Normal heart size and pulmonary vascularity. Postoperative change in the mediastinum. No pneumothorax identified.  Multiple left rib fractures are identified, appearing acute. Fractures involve the lateral left sixth, seventh, and eighth ribs.  IMPRESSION: Multiple acute left rib fractures with associated left pleural effusion and atelectasis or infiltration in the left lung.   Electronically Signed   By: Burman Nieves M.D.   On: 01/24/2014 03:07   Dg Thoracic Spine W/swimmers  01/24/2014   CLINICAL DATA:  Larey Seat 3 days ago with severe upper and lower left Back pain.  EXAM: THORACIC SPINE - 2 VIEW + SWIMMERS  COMPARISON:  Chest 08/02/2005  FINDINGS: Degenerative changes throughout the thoracic spine. No vertebral compression deformities. No paraspinal soft tissue  swelling.  IMPRESSION: Degenerative changes in the thoracic spine. No displaced fractures identified.   Electronically Signed   By: Burman Nieves M.D.   On: 01/24/2014 03:08   Dg Lumbar Spine Complete  01/24/2014   CLINICAL DATA:  Patient fell 3 days ago with severe lower back pain.  EXAM: LUMBAR SPINE - COMPLETE 4+ VIEW  COMPARISON:  None.  FINDINGS: Degenerative changes throughout the lumbar spine. Narrowed lumbar interspaces and associated endplate hypertrophic changes. Normal alignment of the facet joints. Normal alignment of the lumbar vertebrae. No vertebral compression deformities. Vascular calcifications in the abdominal aorta. Calcification in the right upper quadrant consistent with gallstone. Gas-filled bowel loops without distention, suggesting mild ileus.  IMPRESSION: Degenerative changes in the lumbar spine. No displaced acute fractures demonstrated.   Electronically Signed   By: Burman Nieves M.D.   On: 01/24/2014 03:09   Ct Head Wo Contrast  01/24/2014   CLINICAL DATA:  Patient fell 3 days ago, striking head. No  loss of consciousness.  EXAM: CT HEAD WITHOUT CONTRAST  CT CERVICAL SPINE WITHOUT CONTRAST  TECHNIQUE: Multidetector CT imaging of the head and cervical spine was performed following the standard protocol without intravenous contrast. Multiplanar CT image reconstructions of the cervical spine were also generated.  COMPARISON:  07/20/2011  FINDINGS: CT HEAD FINDINGS  Diffuse cerebral atrophy. Ventricular dilatation consistent with central atrophy. Low-attenuation changes in the deep white matter consistent with small vessel ischemia. Old white matter infarcts in the right anterior frontal region. Vascular calcifications. No mass effect or midline shift. No abnormal extra-axial fluid collections. Gray-white matter junctions are distinct. Basal cisterns are not effaced. No evidence of acute intracranial hemorrhage. No depressed skull fractures. Visualized paranasal sinuses and  mastoid air cells are not opacified.  CT CERVICAL SPINE FINDINGS  Diffuse bone demineralization. Normal alignment of the cervical vertebrae. Diffuse degenerative changes throughout the cervical spine with narrowed cervical interspaces and endplate hypertrophic changes. Prominent posterior disc osteophyte complex at C3-4. Normal alignment of the facet joints. Degenerative changes throughout the facet joints. No vertebral compression deformities. No prevertebral soft tissue swelling. No focal bone lesion or bone destruction. Vascular calcifications in the cervical carotid arteries. Soft tissues are unremarkable. Large left pleural effusion. Fibrosis or edema demonstrated in the lung apices.  IMPRESSION: No acute intracranial abnormalities. Diffuse cerebral atrophy and small vessel ischemic changes. 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  Diffuse degenerative changes and demineralization in the cervical spine. Normal alignment. No displaced fractures identified.   Electronically Signed   By: Burman Nieves M.D.   On: 01/24/2014 03:14   Ct Chest Wo Contrast  01/24/2014   CLINICAL DATA:  Patient fell 3 days ago.  Left rib pain.  EXAM: CT CHEST WITHOUT CONTRAST  TECHNIQUE: Multidetector CT imaging of the chest was performed following the standard protocol without IV contrast.  COMPARISON:  01/14/2005  FINDINGS: Large left pleural effusion with consolidation or atelectasis throughout the left lung. Small right pleural effusion with right basilar atelectasis. Appears to be diffuse underlying peripheral fibrosis consistent with chronic usual interstitial pneumonitis. The heart size is normal. Normal caliber thoracic aorta. Postoperative changes with bypass grafting. Native Coronary artery and aortic calcification. No significant lymphadenopathy in the chest. Airways appear patent. Multiple left rib fractures with rib fractures demonstrated in the left sixth, seventh, eighth, and ninth ribs. No pneumothorax.  Degenerative changes in the thoracic spine. No vertebral compression deformities.  Included portions of the upper abdominal organs demonstrate what appears to be a low-attenuation mass in the liver, measuring about 7.7 cm in diameter. This was not present on the previous study. It is incompletely included on the current study. Developing mass or metastatic disease is suspected. There is diffuse upper abdominal ascites which is also new since the previous study.  IMPRESSION: Multiple left rib fractures with large left pleural effusion and atelectasis/ consolidation throughout the left lung. Small right pleural effusion with mild basilar atelectasis. Underlying interstitial fibrosis in the lungs. No pneumothorax. Incidental note of what appears to be a large mass in the liver. Primary or secondary malignancy is suspected. Upper abdominal ascites is also demonstrated.   Electronically Signed   By: Burman Nieves M.D.   On: 01/24/2014 05:46   Ct Cervical Spine Wo Contrast  01/24/2014   CLINICAL DATA:  Patient fell 3 days ago, striking head. No loss of consciousness.  EXAM: CT HEAD WITHOUT CONTRAST  CT CERVICAL SPINE WITHOUT CONTRAST  TECHNIQUE: Multidetector CT imaging of the head and cervical  spine was performed following the standard protocol without intravenous contrast. Multiplanar CT image reconstructions of the cervical spine were also generated.  COMPARISON:  07/20/2011  FINDINGS: CT HEAD FINDINGS  Diffuse cerebral atrophy. Ventricular dilatation consistent with central atrophy. Low-attenuation changes in the deep white matter consistent with small vessel ischemia. Old white matter infarcts in the right anterior frontal region. Vascular calcifications. No mass effect or midline shift. No abnormal extra-axial fluid collections. Gray-white matter junctions are distinct. Basal cisterns are not effaced. No evidence of acute intracranial hemorrhage. No depressed skull fractures. Visualized paranasal sinuses and  mastoid air cells are not opacified.  CT CERVICAL SPINE FINDINGS  Diffuse bone demineralization. Normal alignment of the cervical vertebrae. Diffuse degenerative changes throughout the cervical spine with narrowed cervical interspaces and endplate hypertrophic changes. Prominent posterior disc osteophyte complex at C3-4. Normal alignment of the facet joints. Degenerative changes throughout the facet joints. No vertebral compression deformities. No prevertebral soft tissue swelling. No focal bone lesion or bone destruction. Vascular calcifications in the cervical carotid arteries. Soft tissues are unremarkable. Large left pleural effusion. Fibrosis or edema demonstrated in the lung apices.  IMPRESSION: No acute intracranial abnormalities. Diffuse cerebral atrophy and small vessel ischemic changes. 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  Diffuse degenerative changes and demineralization in the cervical spine. Normal alignment. No displaced fractures identified.   Electronically Signed   By: Burman Nieves M.D.   On: 01/24/2014 03:14   Ct Abdomen Pelvis W Contrast  01/24/2014   CLINICAL DATA:  Hepatic cirrhosis. Esophageal varices with bleeding previously. Prior myocardial infarction. Portal hypertension causing gas trough of the. Chronic kidney disease. The patient fell in the bathroom 3 days ago and was not able to get up until helped the next day. Continued chest pain after the fall. A recent chest CT showed multiple left rib fractures, large left pleural effusion, and a possible mass in the liver. We are further working up that mass.  EXAM: CT ABDOMEN AND PELVIS WITH CONTRAST  TECHNIQUE: Multidetector CT imaging of the abdomen and pelvis was performed using the standard protocol following bolus administration of intravenous contrast.  CONTRAST:  80mL OMNIPAQUE IOHEXOL 300 MG/ML  SOLN  COMPARISON:  01/24/2014; 03/28/2012  FINDINGS: Lower chest: Left posterior eighth and ninth rib fractures with  large left pleural effusion and atelectasis of the left lower lobe noted. Dependent density in the left pleural space suggesting hemothorax. Relatively nondisplaced left anterior rib fractures are also noted. Small right pleural effusion with passive atelectasis. Aortic and mitral calcification along with coronary artery atherosclerotic calcification. Peripheral interstitial accentuation in the lung bases.  Hepatobiliary: As best I can tell the cystic "mass" along the right hepatic lobe actually represents a distended and tortuous gallbladder and cystic duct. It appears to contain the previously seen gallstones along its inferior portions. The common bile duct does not appear dilated. However, there is thrombus in the superior mesenteric vein, portal vein, and portal venous tributaries. No hepatic vein thrombosis is observed. Hepatic cirrhosis noted. Extensive portosystemic varices.  Pancreas: Intact  Spleen: Small peripheral laceration in the upper margin of the spleen, images 24-27 of series 3.  Adrenals/Urinary Tract: Left kidney lower pole benign appearing cyst, 2.0 by 1.7 cm. The ureters are difficult to follow due to all of the surrounding edema but no calculi are visualized. Right bladder cellule.  Stomach/Bowel: Antral wall thickening in the stomach. Wall thickening in the bowel probably due to hypoalbuminemia.  Vascular/Lymphatic: Prominent aortoiliac atherosclerosis. Reactive porta hepatis  lymph nodes.  Reproductive: Calcifications along the margins of the central zone of the prostate gland.  Other: Extensive ascites and mesenteric edema.  Musculoskeletal: Lower lumbar spondylosis. Rib findings as detailed above in the chest section.  IMPRESSION: 1. The cystic mass appears to represent a dilated and tortuous cystic duct and gallbladder and contains the gallstones which were previously clearly within the gallbladder. I doubt that this is a choledochal cyst since there is no choledochal cyst back on  03/28/2012. Occasionally a cystic lymphangioma can occur adjacent to the gallbladder in give a similar appearance. The gallbladder is clearly abnormal and distended with infolding is causing a septated appearance, and in combination with the patient's acute posttraumatic clinical problems and comorbidities, the relative merits of gallbladder removal should be considered. 2. Small peripheral laceration along the superior spleen, grade 1. 3. 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 4. Cirrhosis with extensive mesenteric edema and ascites. Extensive portosystemic varices. 5. Left lower anterior and posterior rib fractures with large left pleural effusion. Small right pleural effusion. 6. Gastric and bowel wall thickening probably from hypoalbuminemia. 7. Atherosclerosis. 8. Extensive   Electronically Signed   By: Herbie BaltimoreWalt  Liebkemann M.D.   On: 01/24/2014 09:18      Patient Active Problem List   Diagnosis Date Noted  . Rib fractures 01/24/2014  . Liver mass 01/24/2014  . Pleural effusion on left 01/24/2014  . Fall from slip, trip, or stumble   . Abdominal pain 08/31/2013  . Ascites 08/31/2013  . Generalized weakness 08/31/2013  . Cirrhosis of liver 03/28/2012  . Upper GI bleed 03/27/2012  . Thrombocytopenia 03/27/2012  . Esophageal varices with bleeding(456.0) 03/27/2012  . DM type 2 (diabetes mellitus, type 2) 10/14/2008  . MYOCARDIAL INFARCTION 10/14/2008  . CORONARY HEART DISEASE 10/14/2008  . ALLERGIC RHINITIS 10/14/2008  . BRONCHITIS 10/14/2008  . ASTHMA 10/14/2008      ASSESSMENT / PLAN:  PULMONARY  A: Appears to have baseline undiagnosed basal ILD NOS - possible UIP on CT Currently has large left pleural effusion    -  high concern for hemothorax; due to hx of fall and left rib fractures  - alternate ddx is malignancy given liver mass (less likely)  - another ddx is hepatic hydrothorax of cirrhosis (even lesss likely given left side and hx)  P:   IR guided  thoracentesis; if hemothorax ( fluid Hct > 50% of Serum Hct or close)- > rec CVTS Chest tube Advise pain control; per [facility]  The fluid removal will be done by Interventional radiology   - they will remove not more than 1.5L  - fluid labs to be sent: cell count, gram stain and culture, cytology for malignant cells, chemistries for LDH, albumin,  Protein, glucose, HEMATOCRIT  - blood labs to be sent on same day or tomorrow: cbc, ldh, protein, albumin glucose, and lipase   - risks of thora from REPE, to ptx, to lung collapse, to vasovagal syncope, to hypotension all explained. Benefit of dyspnea relief and diagnostic potential explained.  He agrees to proceed    D/w Dr Owens LofflerMcKenzie Short of [facility] Updated patient - agreeable with plan    Dr. Kalman ShanMurali Arbutus Nelligan, M.D., [facility] HospitalF.C.C.P Pulmonary and Critical Care Medicine Staff Physician [facility] [place] [facility] Pager: [phone], If no answer or between  15:00h - 7:00h: call [phone]  01/24/2014 10:14 AM

## 2014-01-24 NOTE — Procedures (Signed)
Successful US guided left thoracentesis. Yielded 1 liter of amber/blood tinged fluid. Pt tolerated procedure well. No immediate complications.  Specimen was sent for labs. CXR ordered.  Pattricia Boss D PA-C 01/24/2014 2:45 PM

## 2014-01-24 NOTE — ED Provider Notes (Signed)
CSN: [id number]     Arrival date & time 01/23/14  2335 History   First MD Initiated Contact with Patient 01/24/14 0029     Chief Complaint  Patient presents with  . Fall  . Rib Injury     (Consider location/radiation/quality/duration/timing/severity/associated sxs/prior Treatment) Patient is a 80 y.o. male presenting with fall. The history is provided by the patient.  Fall  He slipped and fell getting into a shower 3 days ago. He may fluid because he was unable to get up. He stayed on the floor until the next day when someone came from [employer]. I'm almost called and he was assisted up into a sofa and he has not been able to get up from the sofa and since then. He denies loss of consciousness but complains of pain in the left rib cage and in the upper and lower back. Pain in the back is only present if he tries to sit up and it is only heard when he tries to move her someone presses on them. He rates pain at 0/10 currently but as I say/10 with attempted movement. He states that he is only here because family insisted that he come to be evaluated. Family members here he states that his mental status at his baseline. He has a history of cirrhosis which he states is secondary to diabetes.  Past Medical History  Diagnosis Date  . Diabetes mellitus   . Hypertension   . Cirrhosis of liver 03/28/2012  . Esophageal varices with bleeding(456.0) 03/27/2012  . Myocardial infarction     x 3  . Portal hypertensive gastropathy   . Chronic kidney disease     mild, no nephrologist  . 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 Esophagogastroduodenoscopy  03/27/2012    Procedure: ESOPHAGOGASTRODUODENOSCOPY (EGD);  Surgeon: Louis Meckel, MD;  Location: [facility];  Service: Endoscopy;  Laterality: N/A;  bedside  . Esophagogastroduodenoscopy  04/19/2012    Procedure: ESOPHAGOGASTRODUODENOSCOPY (EGD);  Surgeon: Louis Meckel, MD;   Location: [facility];  Service: Endoscopy;  Laterality: N/A;  . Esophageal banding  04/19/2012    Procedure: ESOPHAGEAL BANDING;  Surgeon: Louis Meckel, MD;  Location: [facility];  Service: Endoscopy;  Laterality: N/A;  . Esophagogastroduodenoscopy N/A 05/22/2012    Procedure: ESOPHAGOGASTRODUODENOSCOPY (EGD);  Surgeon: Louis Meckel, MD;  Location: [facility];  Service: Endoscopy;  Laterality: N/A;  . Esophageal banding N/A 05/22/2012    Procedure: ESOPHAGEAL BANDING;  Surgeon: Louis Meckel, MD;  Location: [facility];  Service: Endoscopy;  Laterality: N/A;  . For cirrhosis  july 2015    fluid removed for cirrhosis  . Coronary artery bypass graft  1992    x 7   Family History  Problem Relation Age of Onset  . Lymphoma Brother   . Lung cancer Father   . Ovarian cancer Sister   . Heart attack Mother    History  Substance Use Topics  . Smoking status: Former Smoker -- 1.00 packs/day for 2 years    Types: Cigarettes    Quit date: 04/05/1982  . Smokeless tobacco: Never Used  . Alcohol Use: No    Review of Systems  All other systems reviewed and are negative.     Allergies  Codeine  Home Medications   Prior to Admission medications   Medication Sig Start Date End Date Taking? Authorizing Provider  furosemide (LASIX) 20 MG tablet  Take 20 mg by mouth every morning.   Yes Historical Provider, MD  insulin aspart (NOVOLOG) 100 UNIT/ML injection Inject 0-9 Units into the skin 3 (three) times daily as needed for high blood sugar.    Yes Historical Provider, MD  lactulose (CHRONULAC) 10 GM/15ML solution Take 30 mLs (20 g total) by mouth daily. 09/06/13  Yes [name], MD  nadolol (CORGARD) 40 MG tablet Take 40 mg by mouth every morning.   Yes Historical Provider, MD  pantoprazole (PROTONIX) 40 MG tablet Take 40 mg by mouth 2 (two) times daily.   Yes Historical Provider, MD  spironolactone (ALDACTONE) 50 MG tablet Take 50 mg by mouth every morning.   Yes Historical  Provider, MD  traZODone (DESYREL) 50 MG tablet Take 50 mg by mouth at bedtime.   Yes Historical Provider, MD   BP 123/59  Pulse 91  Temp(Src) 98.7 F (37.1 C) (Oral)  Resp 20  SpO2 95% Physical Exam  Nursing note and vitals reviewed.  80 year old male, resting comfortably and in no acute distress. Vital signs are normal. Oxygen saturation is 95%, which is normal. Head is normocephalic and atraumatic. PERRLA, EOMI. Oropharynx is clear. Right ptosis present. Neck is nontender without adenopathy or JVD. Back has point tenderness in the upper thoracic spine and diffusely tender throughout the lumbar spine. There is no CVA tenderness. Lungs are clear without rales, wheezes, or rhonchi. Chest is mildly tender in the left posterior and lateral rib cage without crepitus. Heart has regular rate and rhythm without murmur. Abdomen is soft, flat, nontender without masses or hepatosplenomegaly and peristalsis is normoactive. Extremities have no cyanosis or edema, full range of motion is present. Marked cachexia is present. Skin is warm and dry without rash. Neurologic: Mental status is normal, cranial nerves are intact, there are no motor or sensory deficits.  ED Course  Procedures (including critical care time) Labs Review Results for orders placed during the hospital encounter of 01/23/14  CBC WITH DIFFERENTIAL      Result Value Ref Range   WBC 12.7 (*) 4.0 - 10.5 K/uL   RBC 3.55 (*) 4.22 - 5.81 MIL/uL   Hemoglobin 12.3 (*) 13.0 - 17.0 g/dL   HCT 78.2 (*) 95.6 - 21.3 %   MCV 100.3 (*) 78.0 - 100.0 fL   MCH 34.6 (*) 26.0 - 34.0 pg   MCHC 34.6  30.0 - 36.0 g/dL   RDW 08.6 (*) 57.8 - 46.9 %   Platelets 110 (*) 150 - 400 K/uL   Neutrophils Relative % 77  43 - 77 %   Lymphocytes Relative 14  12 - 46 %   Monocytes Relative 8  3 - 12 %   Eosinophils Relative 1  0 - 5 %   Basophils Relative 0  0 - 1 %   Neutro Abs 9.8 (*) 1.7 - 7.7 K/uL   Lymphs Abs 1.8  0.7 - 4.0 K/uL   Monocytes Absolute  1.0  0.1 - 1.0 K/uL   Eosinophils Absolute 0.1  0.0 - 0.7 K/uL   Basophils Absolute 0.0  0.0 - 0.1 K/uL   Smear Review MORPHOLOGY UNREMARKABLE    COMPREHENSIVE METABOLIC PANEL      Result Value Ref Range   Sodium 132 (*) 137 - 147 mEq/L   Potassium 4.2  3.7 - 5.3 mEq/L   Chloride 95 (*) 96 - 112 mEq/L   CO2 25  19 - 32 mEq/L   Glucose, Bld 162 (*) 70 -  99 mg/dL   BUN 14  6 - 23 mg/dL   Creatinine, Ser 0.451.01  0.50 - 1.35 mg/dL   Calcium 8.2 (*) 8.4 - 10.5 mg/dL   Total Protein 6.8  6.0 - 8.3 g/dL   Albumin 2.0 (*) 3.5 - 5.2 g/dL   AST 33  0 - 37 U/L   ALT 12  0 - 53 U/L   Alkaline Phosphatase 160 (*) 39 - 117 U/L   Total Bilirubin 2.9 (*) 0.3 - 1.2 mg/dL   GFR calc non Af Amer 68 (*) >90 mL/min   GFR calc Af Amer 79 (*) >90 mL/min   Anion gap 12  5 - 15  AMMONIA      Result Value Ref Range   Ammonia 30  11 - 60 umol/L  CK      Result Value Ref Range   Total CK 27  7 - 232 U/L  PROTIME-INR      Result Value Ref Range   Prothrombin Time 18.0 (*) 11.6 - 15.2 seconds   INR 1.47  0.00 - 1.49   Imaging Review Dg Ribs Unilateral W/chest Left  01/24/2014   CLINICAL DATA:  Patient fell 3 days ago. Severe upper and lower back pain. Left posterior rib pain.  EXAM: LEFT RIBS AND CHEST - 3+ VIEW  COMPARISON:  08/02/2005  FINDINGS: There is a moderate size left pleural effusion with infiltration throughout the left lung. Shallow inspiration. Normal heart size and pulmonary vascularity. Postoperative change in the mediastinum. No pneumothorax identified.  Multiple left rib fractures are identified, appearing acute. Fractures involve the lateral left sixth, seventh, and eighth ribs.  IMPRESSION: Multiple acute left rib fractures with associated left pleural effusion and atelectasis or infiltration in the left lung.   Electronically Signed   By: Burman NievesWilliam  Stevens M.D.   On: 01/24/2014 03:07   Dg Thoracic Spine W/swimmers  01/24/2014   CLINICAL DATA:  Larey SeatFell 3 days ago with severe upper and lower  left Back pain.  EXAM: THORACIC SPINE - 2 VIEW + SWIMMERS  COMPARISON:  Chest 08/02/2005  FINDINGS: Degenerative changes throughout the thoracic spine. No vertebral compression deformities. No paraspinal soft tissue swelling.  IMPRESSION: Degenerative changes in the thoracic spine. No displaced fractures identified.   Electronically Signed   By: Burman NievesWilliam  Stevens M.D.   On: 01/24/2014 03:08   Dg Lumbar Spine Complete  01/24/2014   CLINICAL DATA:  Patient fell 3 days ago with severe lower back pain.  EXAM: LUMBAR SPINE - COMPLETE 4+ VIEW  COMPARISON:  None.  FINDINGS: Degenerative changes throughout the lumbar spine. Narrowed lumbar interspaces and associated endplate hypertrophic changes. Normal alignment of the facet joints. Normal alignment of the lumbar vertebrae. No vertebral compression deformities. Vascular calcifications in the abdominal aorta. Calcification in the right upper quadrant consistent with gallstone. Gas-filled bowel loops without distention, suggesting mild ileus.  IMPRESSION: Degenerative changes in the lumbar spine. No displaced acute fractures demonstrated.   Electronically Signed   By: Burman NievesWilliam  Stevens M.D.   On: 01/24/2014 03:09   Ct Head Wo Contrast  01/24/2014   CLINICAL DATA:  Patient fell 3 days ago, striking head. No loss of consciousness.  EXAM: CT HEAD WITHOUT CONTRAST  CT CERVICAL SPINE WITHOUT CONTRAST  TECHNIQUE: Multidetector CT imaging of the head and cervical spine was performed following the standard protocol without intravenous contrast. Multiplanar CT image reconstructions of the cervical spine were also generated.  COMPARISON:  07/20/2011  FINDINGS: CT HEAD FINDINGS  Diffuse  cerebral atrophy. Ventricular dilatation consistent with central atrophy. Low-attenuation changes in the deep white matter consistent with small vessel ischemia. Old white matter infarcts in the right anterior frontal region. Vascular calcifications. No mass effect or midline shift. No abnormal  extra-axial fluid collections. Gray-white matter junctions are distinct. Basal cisterns are not effaced. No evidence of acute intracranial hemorrhage. No depressed skull fractures. Visualized paranasal sinuses and mastoid air cells are not opacified.  CT CERVICAL SPINE FINDINGS  Diffuse bone demineralization. Normal alignment of the cervical vertebrae. Diffuse degenerative changes throughout the cervical spine with narrowed cervical interspaces and endplate hypertrophic changes. Prominent posterior disc osteophyte complex at C3-4. Normal alignment of the facet joints. Degenerative changes throughout the facet joints. No vertebral compression deformities. No prevertebral soft tissue swelling. No focal bone lesion or bone destruction. Vascular calcifications in the cervical carotid arteries. Soft tissues are unremarkable. Large left pleural effusion. Fibrosis or edema demonstrated in the lung apices.  IMPRESSION: No acute intracranial abnormalities. Diffuse cerebral atrophy and small vessel ischemic changes. 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  Diffuse degenerative changes and demineralization in the cervical spine. Normal alignment. No displaced fractures identified.   Electronically Signed   By: Burman Nieves M.D.   On: 01/24/2014 03:14   Ct Chest Wo Contrast  01/24/2014   CLINICAL DATA:  Patient fell 3 days ago.  Left rib pain.  EXAM: CT CHEST WITHOUT CONTRAST  TECHNIQUE: Multidetector CT imaging of the chest was performed following the standard protocol without IV contrast.  COMPARISON:  01/14/2005  FINDINGS: Large left pleural effusion with consolidation or atelectasis throughout the left lung. Small right pleural effusion with right basilar atelectasis. Appears to be diffuse underlying peripheral fibrosis consistent with chronic usual interstitial pneumonitis. The heart size is normal. Normal caliber thoracic aorta. Postoperative changes with bypass grafting. Native Coronary artery and  aortic calcification. No significant lymphadenopathy in the chest. Airways appear patent. Multiple left rib fractures with rib fractures demonstrated in the left sixth, seventh, eighth, and ninth ribs. No pneumothorax. Degenerative changes in the thoracic spine. No vertebral compression deformities.  Included portions of the upper abdominal organs demonstrate what appears to be a low-attenuation mass in the liver, measuring about 7.7 cm in diameter. This was not present on the previous study. It is incompletely included on the current study. Developing mass or metastatic disease is suspected. There is diffuse upper abdominal ascites which is also new since the previous study.  IMPRESSION: Multiple left rib fractures with large left pleural effusion and atelectasis/ consolidation throughout the left lung. Small right pleural effusion with mild basilar atelectasis. Underlying interstitial fibrosis in the lungs. No pneumothorax. Incidental note of what appears to be a large mass in the liver. Primary or secondary malignancy is suspected. Upper abdominal ascites is also demonstrated.   Electronically Signed   By: Burman Nieves M.D.   On: 01/24/2014 05:46   Ct Cervical Spine Wo Contrast  01/24/2014   CLINICAL DATA:  Patient fell 3 days ago, striking head. No loss of consciousness.  EXAM: CT HEAD WITHOUT CONTRAST  CT CERVICAL SPINE WITHOUT CONTRAST  TECHNIQUE: Multidetector CT imaging of the head and cervical spine was performed following the standard protocol without intravenous contrast. Multiplanar CT image reconstructions of the cervical spine were also generated.  COMPARISON:  07/20/2011  FINDINGS: CT HEAD FINDINGS  Diffuse cerebral atrophy. Ventricular dilatation consistent with central atrophy. Low-attenuation changes in the deep white matter consistent with small vessel ischemia. Old white matter infarcts in  the right anterior frontal region. Vascular calcifications. No mass effect or midline shift. No  abnormal extra-axial fluid collections. Gray-white matter junctions are distinct. Basal cisterns are not effaced. No evidence of acute intracranial hemorrhage. No depressed skull fractures. Visualized paranasal sinuses and mastoid air cells are not opacified.  CT CERVICAL SPINE FINDINGS  Diffuse bone demineralization. Normal alignment of the cervical vertebrae. Diffuse degenerative changes throughout the cervical spine with narrowed cervical interspaces and endplate hypertrophic changes. Prominent posterior disc osteophyte complex at C3-4. Normal alignment of the facet joints. Degenerative changes throughout the facet joints. No vertebral compression deformities. No prevertebral soft tissue swelling. No focal bone lesion or bone destruction. Vascular calcifications in the cervical carotid arteries. Soft tissues are unremarkable. Large left pleural effusion. Fibrosis or edema demonstrated in the lung apices.  IMPRESSION: No acute intracranial abnormalities. Diffuse cerebral atrophy and small vessel ischemic changes. 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  Diffuse degenerative changes and demineralization in the cervical spine. Normal alignment. No displaced fractures identified.   Electronically Signed   By: Burman Nieves M.D.   On: 01/24/2014 03:14   Images viewed by me.  MDM   Final diagnoses:  Fall from slip, trip, or stumble  Fall from slip, trip, or stumble, initial encounter  Multiple rib fractures, left, closed, initial encounter  Pleural effusion on left  Liver lesion    Fall with injury suspicious for rib fracture. I am concerned about his inability to stand up since then. His screening labs will be obtained. He is sent for x-rays. CT will also be obtained of head and cervical spine. Plain x-rays were obtained of thoracic spine and lumbar spine.   X-rays show left rib fractures and probable large left pleural effusion. Laboratory workup is unremarkable. He has elevated bilirubin  which is unchanged from baseline. Alkaline phosphatase is mildly elevated and this may represent his reaction to the rib fractures. Mother atrium is probably clinically insignificant. I am still concerned about his inability to stand at home. CT was obtained of his chest to evaluate the pleural effusion make sure there is not an underlying pneumonia. This shows a very large left pleural effusion but also incidental finding is made of a 7 cm mass in the liver. Decision is made to admit the patient for further evaluation. Case is discussed with Dr. Adela Glimpse of diet hospitalist who agrees to admit the patient.    Dione Booze, MD 01/24/14 [phone]

## 2014-01-24 NOTE — Telephone Encounter (Signed)
Corecction, he is for an EGD with banding. Dx is cirrhosis and esophageal varices. Do you want me to cancel?

## 2014-01-24 NOTE — ED Notes (Signed)
Please update Rahul Barrett (son) [phone] or Faye(Girlfriend) [phone] on pt's disposition(discharge or admision). Pt has given verbal consent.

## 2014-01-24 NOTE — Consult Note (Signed)
Consultation  Referring Provider:  [facility]    Primary Care Physician:  Dorrene GermanAVBUERE,EDWIN A, MD Primary Gastroenterologist: Melvia Heapsobert Kaplan, MD        Reason for Consultation: liver lesion             HPI:   Alex Wise is a 80 y.o. male with multiple medical problems. He is known to Dr. Arlyce DiceKaplan for a history of cryptogenic cirrhosis complicated by variceal bleed in 2013 and SBP in May of this year. Patient was seen last in the office early September. Patient was doing okay at that time. Labs, including AFP in September were at baseline. He was scheduled for surveillance EGD which had been previously rescheduled secondary illness.   Patient brought to ED yesterday after a fall at home. He had been on the floor for at least a day before being discovered. He has multiple acute left rib fractures. No acute findings on head CTscan.  Chest CTscan revealed large left pleural effusion. Incidentally a 7.7 cm low attenuation mass seen in liver. Upper abdominal ascites present.   Patient had been doing okay at home from a liver standpoint. No abdominal pain, no significant reaccumulation of ascites on low dose lasix and aldactone. He does report significant weight loss over last few months but appetite is poor and has also had a few LVPs.   Past Medical History  Diagnosis Date  . Hypertension   . Cirrhosis of liver 03/28/2012  . Esophageal varices with bleeding(456.0) 03/27/2012  . Myocardial infarction     x 3  . Portal hypertensive gastropathy   . Chronic kidney disease     mild, no nephrologist  . 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 Diabetes mellitus     Past Surgical History  Procedure Laterality Date  . Esophagogastroduodenoscopy  03/27/2012    Procedure: ESOPHAGOGASTRODUODENOSCOPY (EGD);  Surgeon: Louis Meckelobert D Kaplan, MD;  Location: [facility];  Service: Endoscopy;  Laterality: N/A;  bedside  . Esophagogastroduodenoscopy  04/19/2012    Procedure:  ESOPHAGOGASTRODUODENOSCOPY (EGD);  Surgeon: Louis Meckelobert D Kaplan, MD;  Location: [facility];  Service: Endoscopy;  Laterality: N/A;  . Esophageal banding  04/19/2012    Procedure: ESOPHAGEAL BANDING;  Surgeon: Louis Meckelobert D Kaplan, MD;  Location: [facility];  Service: Endoscopy;  Laterality: N/A;  . Esophagogastroduodenoscopy N/A 05/22/2012    Procedure: ESOPHAGOGASTRODUODENOSCOPY (EGD);  Surgeon: Louis Meckelobert D Kaplan, MD;  Location: [facility];  Service: Endoscopy;  Laterality: N/A;  . Esophageal banding N/A 05/22/2012    Procedure: ESOPHAGEAL BANDING;  Surgeon: Louis Meckelobert D Kaplan, MD;  Location: [facility];  Service: Endoscopy;  Laterality: N/A;  . For cirrhosis  july 2015    fluid removed for cirrhosis  . Coronary artery bypass graft  1992    x 7    Family History  Problem Relation Age of Onset  . Lymphoma Brother   . Lung cancer Father   . Ovarian cancer Sister   . Heart attack Mother      History  Substance Use Topics  . Smoking status: Former Smoker -- 1.00 packs/day for 2 years    Types: Cigarettes    Quit date: 04/05/1982  . Smokeless tobacco: Never Used  . Alcohol Use: No    Prior to Admission medications   Medication Sig Start Date End Date Taking? Authorizing Provider  furosemide (LASIX) 20 MG tablet Take 20 mg by mouth every morning.   Yes Historical Provider, MD  insulin aspart (NOVOLOG)  100 UNIT/ML injection Inject 0-9 Units into the skin 3 (three) times daily as needed for high blood sugar.    Yes Historical Provider, MD  lactulose (CHRONULAC) 10 GM/15ML solution Take 30 mLs (20 g total) by mouth daily. 09/06/13  Yes [name], MD  nadolol (CORGARD) 40 MG tablet Take 40 mg by mouth every morning.   Yes Historical Provider, MD  pantoprazole (PROTONIX) 40 MG tablet Take 40 mg by mouth 2 (two) times daily.   Yes Historical Provider, MD  spironolactone (ALDACTONE) 50 MG tablet Take 50 mg by mouth every morning.   Yes Historical Provider, MD  traZODone (DESYREL) 50 MG tablet Take  50 mg by mouth at bedtime.   Yes Historical Provider, MD    Current Facility-Administered Medications  Medication Dose Route Frequency Provider Last Rate Last Dose  . 0.9 %  sodium chloride infusion  250 mL Intravenous PRN Therisa Doyne, MD      . 0.9 %  sodium chloride infusion   Intravenous Continuous Louis Meckel, MD      . albuterol (PROVENTIL) (2.5 MG/3ML) 0.083% nebulizer solution 2.5 mg  2.5 mg Nebulization Q2H PRN Therisa Doyne, MD      . docusate sodium (COLACE) capsule 100 mg  100 mg Oral BID Therisa Doyne, MD      . furosemide (LASIX) tablet 20 mg  20 mg Oral Daily Therisa Doyne, MD      . insulin aspart (novoLOG) injection 0-9 Units  0-9 Units Subcutaneous 6 times per day Therisa Doyne, MD      . lactulose (CHRONULAC) 10 GM/15ML solution 20 g  20 g Oral Daily Therisa Doyne, MD      . morphine 2 MG/ML injection 2 mg  2 mg Intravenous Q3H PRN Therisa Doyne, MD      . nadolol (CORGARD) tablet 40 mg  40 mg Oral q morning - 10a Therisa Doyne, MD      . ondansetron (ZOFRAN) tablet 4 mg  4 mg Oral Q6H PRN Therisa Doyne, MD       Or  . ondansetron (ZOFRAN) injection 4 mg  4 mg Intravenous Q6H PRN Therisa Doyne, MD      . pantoprazole (PROTONIX) EC tablet 40 mg  40 mg Oral BID Therisa Doyne, MD      . sodium chloride 0.9 % injection 3 mL  3 mL Intravenous Q12H Anastassia Doutova, MD      . sodium chloride 0.9 % injection 3 mL  3 mL Intravenous PRN Therisa Doyne, MD      . spironolactone (ALDACTONE) tablet 50 mg  50 mg Oral q morning - 10a Therisa Doyne, MD      . traZODone (DESYREL) tablet 50 mg  50 mg Oral QHS Therisa Doyne, MD        Allergies as of 01/23/2014 - Review Complete 01/23/2014  Allergen Reaction Noted  . Codeine Nausea And Vomiting     Review of Systems:    All systems reviewed and negative except where noted in HPI.   Physical Exam:  Vital signs in last 24 hours: Temp:  [97.8 F (36.6 C)-98.7  F (37.1 C)] 97.8 F (36.6 C) (10/22 0915) Pulse Rate:  [83-92] 86 (10/22 0915) Resp:  [13-29] 20 (10/22 0915) BP: (107-123)/(55-70) 109/70 mmHg (10/22 0915) SpO2:  [89 %-98 %] 98 % (10/22 0915) Weight:  [136 lb 11 oz (62 kg)] 136 lb 11 oz (62 kg) (10/22 0915) Last BM Date: 01/20/14 General:   Pleasant white  male in NAD Head:  Normocephalic and atraumatic. Eyes:   No icterus.   Conjunctiva pink. Ears:  Normal auditory acuity. Neck:  Supple; no masses felt Lungs:  Respirations even and unlabored. He is unable to take deep breaths secondary to pain from fractures.   Heart:  Regular rate and rhythm;  murmur heard. Abdomen:  Soft, mildly distended, tympanitic. Normal bowel sounds. No appreciable masses or hepatomegaly.  Rectal:  Not performed.  Msk:  Symmetrical without gross deformities.  Extremities:  Without edema. Neurologic:  Alert and  oriented x4;  grossly normal neurologically. Skin:  Intact without significant lesions or rashes. Cervical Nodes:  No significant cervical adenopathy. Psych:  Alert and cooperative. Normal affect.  LAB RESULTS:  Recent Labs  01/24/14 0206  WBC 12.7*  HGB 12.3*  HCT 35.6*  PLT 110*   BMET  Recent Labs  01/24/14 0206  NA 132*  K 4.2  CL 95*  CO2 25  GLUCOSE 162*  BUN 14  CREATININE 1.01  CALCIUM 8.2*   LFT  Recent Labs  01/24/14 0206  PROT 6.8  ALBUMIN 2.0*  AST 33  ALT 12  ALKPHOS 160*  BILITOT 2.9*   PT/INR  Recent Labs  01/24/14 0206  LABPROT 18.0*  INR 1.47    STUDIES: Ct Abdomen Pelvis W Contrast  01/24/2014   CLINICAL DATA:  Hepatic cirrhosis. Esophageal varices with bleeding previously. Prior myocardial infarction. Portal hypertension causing gas trough of the. Chronic kidney disease. The patient fell in the bathroom 3 days ago and was not able to get up until helped the next day. Continued chest pain after the fall. A recent chest CT showed multiple left rib fractures, large left pleural effusion, and a  possible mass in the liver. We are further working up that mass.  EXAM: CT ABDOMEN AND PELVIS WITH CONTRAST  TECHNIQUE: Multidetector CT imaging of the abdomen and pelvis was performed using the standard protocol following bolus administration of intravenous contrast.  CONTRAST:  80mL OMNIPAQUE IOHEXOL 300 MG/ML  SOLN  COMPARISON:  01/24/2014; 03/28/2012  FINDINGS: Lower chest: Left posterior eighth and ninth rib fractures with large left pleural effusion and atelectasis of the left lower lobe noted. Dependent density in the left pleural space suggesting hemothorax. Relatively nondisplaced left anterior rib fractures are also noted. Small right pleural effusion with passive atelectasis. Aortic and mitral calcification along with coronary artery atherosclerotic calcification. Peripheral interstitial accentuation in the lung bases.  Hepatobiliary: As best I can tell the cystic "mass" along the right hepatic lobe actually represents a distended and tortuous gallbladder and cystic duct. It appears to contain the previously seen gallstones along its inferior portions. The common bile duct does not appear dilated. However, there is thrombus in the superior mesenteric vein, portal vein, and portal venous tributaries. No hepatic vein thrombosis is observed. Hepatic cirrhosis noted. Extensive portosystemic varices.  Pancreas: Intact  Spleen: Small peripheral laceration in the upper margin of the spleen, images 24-27 of series 3.  Adrenals/Urinary Tract: Left kidney lower pole benign appearing cyst, 2.0 by 1.7 cm. The ureters are difficult to follow due to all of the surrounding edema but no calculi are visualized. Right bladder cellule.  Stomach/Bowel: Antral wall thickening in the stomach. Wall thickening in the bowel probably due to hypoalbuminemia.  Vascular/Lymphatic: Prominent aortoiliac atherosclerosis. Reactive porta hepatis lymph nodes.  Reproductive: Calcifications along the margins of the central zone of the  prostate gland.  Other: Extensive ascites and mesenteric edema.  Musculoskeletal: Lower lumbar spondylosis.  Rib findings as detailed above in the chest section.  IMPRESSION: 1. The cystic mass appears to represent a dilated and tortuous cystic duct and gallbladder and contains the gallstones which were previously clearly within the gallbladder. I doubt that this is a choledochal cyst since there is no choledochal cyst back on 03/28/2012. Occasionally a cystic lymphangioma can occur adjacent to the gallbladder in give a similar appearance. The gallbladder is clearly abnormal and distended with infolding is causing a septated appearance, and in combination with the patient's acute posttraumatic clinical problems and comorbidities, the relative merits of gallbladder removal should be considered. 2. Small peripheral laceration along the superior spleen, grade 1. 3. 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 4. Cirrhosis with extensive mesenteric edema and ascites. Extensive portosystemic varices. 5. Left lower anterior and posterior rib fractures with large left pleural effusion. Small right pleural effusion. 6. Gastric and bowel wall thickening probably from hypoalbuminemia. 7. Atherosclerosis. 8. Extensive   Electronically Signed   By: Herbie Baltimore M.D.   On: 01/24/2014 09:18     Impression / Plan:   55. 80 year old male known to [place] for history of cryptogenic cirrhosis complicated by portal HTN with variceal bleeding and SBP. Patient had actually been doing okay from a liver standpoint over last couple of months. Now admitted post fall at home. Liver mass incidentally found on chest CTscan. Mass further evaluated by abdominal CTscan today. The "mass" is cystic and appears to represent a dilated /tortuous cystic duct and gallbladder. He has a normal AFP. Surgery evaluating gallbladder. Obviously patient is a high surgical risk. Continue home diuretics and Corgard. Low sodium diet.  2. Hx of SBP May  2015. Patient is supposed to be on prophylactic antibiotics but not listed on home med list and patient doesn't know.    3. Acute left rib fractures  4. Left pleural effusion, workup in progress.  Thanks   LOS: 1 day   Willette Cluster  01/24/2014, 11:45 AM  GI ATTENDING  History, laboratories, x-rays, prior endoscopy reports reviewed. Patient personally seen and examined. Family in room. Agree with H&P as outlined above. Patient has multiple significant medical problems. He is followed by Dr. Arlyce Dice for advanced liver disease. Admitted with tramatic fall. Initial imaging studies raise the question of hepatic mass. Subsequent dedicated imaging could not confirm such. Rather, anatomic variant. No further workup needed. He does have a history of SBP and should be on antibiotic prophylaxis. Followup with Dr. Arlyce Dice as needed. Will sign off.  Wilhemina Bonito. Eda Keys., M.D. [facility] Division of Gastroenterology

## 2014-01-24 NOTE — ED Notes (Signed)
Report given to Merlyn Albert, RN on [address] All questions answered by this nurse CBG to be checked prior to transporting patient upstairs Patient in NAD upon leaving unit

## 2014-01-24 NOTE — Progress Notes (Signed)
[facility] PROGRESS NOTE  Alex Wise ZOX:[id number] DOB: 10-31-1931 DOA: 01/23/2014 PCP: Dorrene German, MD  Assessment/Plan  Fall, mechanical -   PT/OT consults  Multiple rib fractures -  Continue pain control with IV pain medications for breakthrough and oral for moderate to severe pain - Continue incentive spirometry  Splenic laceration, hemoglobin mildly decreased -  Repeat CBC in a.m.  Large left pleural effusion status post thoracentesis on 10/22, hematocrit still pending -  If this is a hemothorax, will need a CT surgery consultation   Cryptogenic cirrhosis, seen by gastroenterology because of possible new liver mass, however ultimately this was determined to be his gallbladder -  Continue beta blocker and SBP prophylaxis  Abnormal appearing gallbladder, was concerning for possible malignancy based on CT scan from this morning, but this is a chronic issue and his AFP was recently negative  Thrombocytopenia, secondary to cirrhosis -  Repeat CBC in a.m.  Hyponatremia, likely secondary to cirrhosis -  Repeat in a.m.  Diet:  Regular  Access:  PIV  IVF:  Off  Proph:  SCDs  Code Status: Full  Family Communication: Patient and his niece  Disposition Plan: Pending physical therapy evaluation   Consultants:  Gastroenterology  General surgery  PCCM  Radiology for thoracentesis  Procedures:  CT chest CT abdomen and pelvis Chest x-ray   ultrasound-guided thoracentesis of the left pleural effusion   Antibiotics:  Negative   HPI/Subjective:  Patient states that he has a mild improvement in the shortness of breath after having a thoracentesis today. His legs feel weak.     Objective: Filed Vitals:   01/24/14 1439 01/24/14 1441 01/24/14 1446 01/24/14 1710  BP: 93/38 89/41 101/42 101/57  Pulse:    70  Temp:    98.1 F (36.7 C)  TempSrc:    Oral  Resp:    16  Height:      Weight:      SpO2:    98%   No intake or output data in the 24  hours ending 01/24/14 1912 Filed Weights   01/24/14 0915  Weight: 62 kg (136 lb 11 oz)    Exam:   General:   cachectic white male,No acute distress  HEENT:  NCAT, MMM  Cardiovascular:  RRR, nl S1, S2 no mrg, 2+ pulses, warm extremities  Respiratory:   diminished breath sounds to the midback on the left side, no focal rales, rhonchi, or wheeze , no increased WOB  Abdomen:   NABS, soft, NT/ND  MSK:    decreased tone and bulk, no LEE  Neuro:  Grossly intact  Data Reviewed: Basic Metabolic Panel:  Recent Labs Lab 01/24/14 0206  NA 132*  K 4.2  CL 95*  CO2 25  GLUCOSE 162*  BUN 14  CREATININE 1.01  CALCIUM 8.2*   Liver Function Tests:  Recent Labs Lab 01/24/14 0206  AST 33  ALT 12  ALKPHOS 160*  BILITOT 2.9*  PROT 6.8  ALBUMIN 2.0*   No results found for this basename: LIPASE, AMYLASE,  in the last 168 hours  Recent Labs Lab 01/24/14 0206  AMMONIA 30   CBC:  Recent Labs Lab 01/24/14 0206  WBC 12.7*  NEUTROABS 9.8*  HGB 12.3*  HCT 35.6*  MCV 100.3*  PLT 110*   Cardiac Enzymes:  Recent Labs Lab 01/24/14 0206  CKTOTAL 27   BNP (last 3 results) No results found for this basename: PROBNP,  in the last 8760 hours CBG:  Recent Labs Lab  01/24/14 0854 01/24/14 1153 01/24/14 1707  GLUCAP 119* 113* 108*    No results found for this or any previous visit (from the past 240 hour(s)).   Studies: Dg Chest 1 View  01/24/2014   CLINICAL DATA:  Status post left thoracentesis.  EXAM: CHEST - 1 VIEW  COMPARISON:  CT chest 01/24/2014 and 5:23 a.m.  FINDINGS: Left pleural effusion is decreased after thoracentesis. No pneumothorax is identified. Small right effusion is noted. Left worse than right atelectatic change is identified. Heart size is normal.  IMPRESSION: Decreased left pleural effusion after thoracentesis. Negative for pneumothorax.   Electronically Signed   By: Drusilla Kanner M.D.   On: 01/24/2014 15:18   Dg Ribs Unilateral W/chest  Left  01/24/2014   CLINICAL DATA:  Patient fell 3 days ago. Severe upper and lower back pain. Left posterior rib pain.  EXAM: LEFT RIBS AND CHEST - 3+ VIEW  COMPARISON:  08/02/2005  FINDINGS: There is a moderate size left pleural effusion with infiltration throughout the left lung. Shallow inspiration. Normal heart size and pulmonary vascularity. Postoperative change in the mediastinum. No pneumothorax identified.  Multiple left rib fractures are identified, appearing acute. Fractures involve the lateral left sixth, seventh, and eighth ribs.  IMPRESSION: Multiple acute left rib fractures with associated left pleural effusion and atelectasis or infiltration in the left lung.   Electronically Signed   By: Burman Nieves M.D.   On: 01/24/2014 03:07   Dg Thoracic Spine W/swimmers  01/24/2014   CLINICAL DATA:  Larey Seat 3 days ago with severe upper and lower left Back pain.  EXAM: THORACIC SPINE - 2 VIEW + SWIMMERS  COMPARISON:  Chest 08/02/2005  FINDINGS: Degenerative changes throughout the thoracic spine. No vertebral compression deformities. No paraspinal soft tissue swelling.  IMPRESSION: Degenerative changes in the thoracic spine. No displaced fractures identified.   Electronically Signed   By: Burman Nieves M.D.   On: 01/24/2014 03:08   Dg Lumbar Spine Complete  01/24/2014   CLINICAL DATA:  Patient fell 3 days ago with severe lower back pain.  EXAM: LUMBAR SPINE - COMPLETE 4+ VIEW  COMPARISON:  None.  FINDINGS: Degenerative changes throughout the lumbar spine. Narrowed lumbar interspaces and associated endplate hypertrophic changes. Normal alignment of the facet joints. Normal alignment of the lumbar vertebrae. No vertebral compression deformities. Vascular calcifications in the abdominal aorta. Calcification in the right upper quadrant consistent with gallstone. Gas-filled bowel loops without distention, suggesting mild ileus.  IMPRESSION: Degenerative changes in the lumbar spine. No displaced acute  fractures demonstrated.   Electronically Signed   By: Burman Nieves M.D.   On: 01/24/2014 03:09   Ct Head Wo Contrast  01/24/2014   CLINICAL DATA:  Patient fell 3 days ago, striking head. No loss of consciousness.  EXAM: CT HEAD WITHOUT CONTRAST  CT CERVICAL SPINE WITHOUT CONTRAST  TECHNIQUE: Multidetector CT imaging of the head and cervical spine was performed following the standard protocol without intravenous contrast. Multiplanar CT image reconstructions of the cervical spine were also generated.  COMPARISON:  07/20/2011  FINDINGS: CT HEAD FINDINGS  Diffuse cerebral atrophy. Ventricular dilatation consistent with central atrophy. Low-attenuation changes in the deep white matter consistent with small vessel ischemia. Old white matter infarcts in the right anterior frontal region. Vascular calcifications. No mass effect or midline shift. No abnormal extra-axial fluid collections. Gray-white matter junctions are distinct. Basal cisterns are not effaced. No evidence of acute intracranial hemorrhage. No depressed skull fractures. Visualized paranasal sinuses and mastoid air cells  are not opacified.  CT CERVICAL SPINE FINDINGS  Diffuse bone demineralization. Normal alignment of the cervical vertebrae. Diffuse degenerative changes throughout the cervical spine with narrowed cervical interspaces and endplate hypertrophic changes. Prominent posterior disc osteophyte complex at C3-4. Normal alignment of the facet joints. Degenerative changes throughout the facet joints. No vertebral compression deformities. No prevertebral soft tissue swelling. No focal bone lesion or bone destruction. Vascular calcifications in the cervical carotid arteries. Soft tissues are unremarkable. Large left pleural effusion. Fibrosis or edema demonstrated in the lung apices.  IMPRESSION: No acute intracranial abnormalities. Diffuse cerebral atrophy and small vessel ischemic changes. 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   Diffuse degenerative changes and demineralization in the cervical spine. Normal alignment. No displaced fractures identified.   Electronically Signed   By: Burman Nieves M.D.   On: 01/24/2014 03:14   Ct Chest Wo Contrast  01/24/2014   CLINICAL DATA:  Patient fell 3 days ago.  Left rib pain.  EXAM: CT CHEST WITHOUT CONTRAST  TECHNIQUE: Multidetector CT imaging of the chest was performed following the standard protocol without IV contrast.  COMPARISON:  01/14/2005  FINDINGS: Large left pleural effusion with consolidation or atelectasis throughout the left lung. Small right pleural effusion with right basilar atelectasis. Appears to be diffuse underlying peripheral fibrosis consistent with chronic usual interstitial pneumonitis. The heart size is normal. Normal caliber thoracic aorta. Postoperative changes with bypass grafting. Native Coronary artery and aortic calcification. No significant lymphadenopathy in the chest. Airways appear patent. Multiple left rib fractures with rib fractures demonstrated in the left sixth, seventh, eighth, and ninth ribs. No pneumothorax. Degenerative changes in the thoracic spine. No vertebral compression deformities.  Included portions of the upper abdominal organs demonstrate what appears to be a low-attenuation mass in the liver, measuring about 7.7 cm in diameter. This was not present on the previous study. It is incompletely included on the current study. Developing mass or metastatic disease is suspected. There is diffuse upper abdominal ascites which is also new since the previous study.  IMPRESSION: Multiple left rib fractures with large left pleural effusion and atelectasis/ consolidation throughout the left lung. Small right pleural effusion with mild basilar atelectasis. Underlying interstitial fibrosis in the lungs. No pneumothorax. Incidental note of what appears to be a large mass in the liver. Primary or secondary malignancy is suspected. Upper abdominal ascites is  also demonstrated.   Electronically Signed   By: Burman Nieves M.D.   On: 01/24/2014 05:46   Ct Cervical Spine Wo Contrast  01/24/2014   CLINICAL DATA:  Patient fell 3 days ago, striking head. No loss of consciousness.  EXAM: CT HEAD WITHOUT CONTRAST  CT CERVICAL SPINE WITHOUT CONTRAST  TECHNIQUE: Multidetector CT imaging of the head and cervical spine was performed following the standard protocol without intravenous contrast. Multiplanar CT image reconstructions of the cervical spine were also generated.  COMPARISON:  07/20/2011  FINDINGS: CT HEAD FINDINGS  Diffuse cerebral atrophy. Ventricular dilatation consistent with central atrophy. Low-attenuation changes in the deep white matter consistent with small vessel ischemia. Old white matter infarcts in the right anterior frontal region. Vascular calcifications. No mass effect or midline shift. No abnormal extra-axial fluid collections. Gray-white matter junctions are distinct. Basal cisterns are not effaced. No evidence of acute intracranial hemorrhage. No depressed skull fractures. Visualized paranasal sinuses and mastoid air cells are not opacified.  CT CERVICAL SPINE FINDINGS  Diffuse bone demineralization. Normal alignment of the cervical vertebrae. Diffuse degenerative changes throughout the cervical spine  with narrowed cervical interspaces and endplate hypertrophic changes. Prominent posterior disc osteophyte complex at C3-4. Normal alignment of the facet joints. Degenerative changes throughout the facet joints. No vertebral compression deformities. No prevertebral soft tissue swelling. No focal bone lesion or bone destruction. Vascular calcifications in the cervical carotid arteries. Soft tissues are unremarkable. Large left pleural effusion. Fibrosis or edema demonstrated in the lung apices.  IMPRESSION: No acute intracranial abnormalities. Diffuse cerebral atrophy and small vessel ischemic changes. 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   Diffuse degenerative changes and demineralization in the cervical spine. Normal alignment. No displaced fractures identified.   Electronically Signed   By: Burman NievesWilliam  Stevens M.D.   On: 01/24/2014 03:14   Ct Abdomen Pelvis W Contrast  01/24/2014   CLINICAL DATA:  Hepatic cirrhosis. Esophageal varices with bleeding previously. Prior myocardial infarction. Portal hypertension causing gas trough of the. Chronic kidney disease. The patient fell in the bathroom 3 days ago and was not able to get up until helped the next day. Continued chest pain after the fall. A recent chest CT showed multiple left rib fractures, large left pleural effusion, and a possible mass in the liver. We are further working up that mass.  EXAM: CT ABDOMEN AND PELVIS WITH CONTRAST  TECHNIQUE: Multidetector CT imaging of the abdomen and pelvis was performed using the standard protocol following bolus administration of intravenous contrast.  CONTRAST:  80mL OMNIPAQUE IOHEXOL 300 MG/ML  SOLN  COMPARISON:  01/24/2014; 03/28/2012  FINDINGS: Lower chest: Left posterior eighth and ninth rib fractures with large left pleural effusion and atelectasis of the left lower lobe noted. Dependent density in the left pleural space suggesting hemothorax. Relatively nondisplaced left anterior rib fractures are also noted. Small right pleural effusion with passive atelectasis. Aortic and mitral calcification along with coronary artery atherosclerotic calcification. Peripheral interstitial accentuation in the lung bases.  Hepatobiliary: As best I can tell the cystic "mass" along the right hepatic lobe actually represents a distended and tortuous gallbladder and cystic duct. It appears to contain the previously seen gallstones along its inferior portions. The common bile duct does not appear dilated. However, there is thrombus in the superior mesenteric vein, portal vein, and portal venous tributaries. No hepatic vein thrombosis is observed. Hepatic cirrhosis noted.  Extensive portosystemic varices.  Pancreas: Intact  Spleen: Small peripheral laceration in the upper margin of the spleen, images 24-27 of series 3.  Adrenals/Urinary Tract: Left kidney lower pole benign appearing cyst, 2.0 by 1.7 cm. The ureters are difficult to follow due to all of the surrounding edema but no calculi are visualized. Right bladder cellule.  Stomach/Bowel: Antral wall thickening in the stomach. Wall thickening in the bowel probably due to hypoalbuminemia.  Vascular/Lymphatic: Prominent aortoiliac atherosclerosis. Reactive porta hepatis lymph nodes.  Reproductive: Calcifications along the margins of the central zone of the prostate gland.  Other: Extensive ascites and mesenteric edema.  Musculoskeletal: Lower lumbar spondylosis. Rib findings as detailed above in the chest section.  IMPRESSION: 1. The cystic mass appears to represent a dilated and tortuous cystic duct and gallbladder and contains the gallstones which were previously clearly within the gallbladder. I doubt that this is a choledochal cyst since there is no choledochal cyst back on 03/28/2012. Occasionally a cystic lymphangioma can occur adjacent to the gallbladder in give a similar appearance. The gallbladder is clearly abnormal and distended with infolding is causing a septated appearance, and in combination with the patient's acute posttraumatic clinical problems and comorbidities, the relative merits of  gallbladder removal should be considered. 2. Small peripheral laceration along the superior spleen, grade 1. 3. 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 4. Cirrhosis with extensive mesenteric edema and ascites. Extensive portosystemic varices. 5. Left lower anterior and posterior rib fractures with large left pleural effusion. Small right pleural effusion. 6. Gastric and bowel wall thickening probably from hypoalbuminemia. 7. Atherosclerosis. 8. Extensive   Electronically Signed   By: Herbie Baltimore M.D.   On:  01/24/2014 09:18    Scheduled Meds: . docusate sodium  100 mg Oral BID  . furosemide  20 mg Oral Daily  . insulin aspart  0-5 Units Subcutaneous QHS  . [START ON 01/25/2014] insulin aspart  0-9 Units Subcutaneous TID WC  . lactulose  20 g Oral Daily  . nadolol  40 mg Oral q morning - 10a  . pantoprazole  40 mg Oral BID  . sodium chloride  3 mL Intravenous Q12H  . spironolactone  50 mg Oral q morning - 10a  . traZODone  50 mg Oral QHS   Continuous Infusions: . sodium chloride 20 mL/hr at 01/24/14 1251    Principal Problem:   Pleural effusion on left Active Problems:   DM type 2 (diabetes mellitus, type 2)   Thrombocytopenia   Cirrhosis of liver   Rib fractures   Liver mass    Time spent: 30 min    Jerrye Seebeck, [facility]  [facility] Pager [phone]. If 7PM-7AM, please contact night-coverage at [email or web address], password [facility] 01/24/2014, 7:12 PM  LOS: 1 day

## 2014-01-25 DIAGNOSIS — S36039A Unspecified laceration of spleen, initial encounter: Secondary | ICD-10-CM

## 2014-01-25 DIAGNOSIS — D696 Thrombocytopenia, unspecified: Secondary | ICD-10-CM

## 2014-01-25 DIAGNOSIS — J948 Other specified pleural conditions: Secondary | ICD-10-CM

## 2014-01-25 DIAGNOSIS — K7460 Unspecified cirrhosis of liver: Secondary | ICD-10-CM

## 2014-01-25 DIAGNOSIS — S2232XD Fracture of one rib, left side, subsequent encounter for fracture with routine healing: Secondary | ICD-10-CM

## 2014-01-25 LAB — COMPREHENSIVE METABOLIC PANEL
ALT: 10 U/L (ref 0–53)
AST: 26 U/L (ref 0–37)
Albumin: 1.8 g/dL — ABNORMAL LOW (ref 3.5–5.2)
Alkaline Phosphatase: 131 U/L — ABNORMAL HIGH (ref 39–117)
Anion gap: 12 (ref 5–15)
BILIRUBIN TOTAL: 3 mg/dL — AB (ref 0.3–1.2)
BUN: 18 mg/dL (ref 6–23)
CHLORIDE: 95 meq/L — AB (ref 96–112)
CO2: 26 meq/L (ref 19–32)
CREATININE: 1.01 mg/dL (ref 0.50–1.35)
Calcium: 8.3 mg/dL — ABNORMAL LOW (ref 8.4–10.5)
GFR calc Af Amer: 79 mL/min — ABNORMAL LOW (ref >90)
GFR, EST NON AFRICAN AMERICAN: 68 mL/min — AB (ref >90)
Glucose, Bld: 105 mg/dL — ABNORMAL HIGH (ref 70–99)
Potassium: 3.9 mEq/L (ref 3.7–5.3)
Sodium: 133 mEq/L — ABNORMAL LOW (ref 137–147)
Total Protein: 6.1 g/dL (ref 6.0–8.3)

## 2014-01-25 LAB — CBC
HCT: 31.9 % — ABNORMAL LOW (ref 39.0–52.0)
Hemoglobin: 10.9 g/dL — ABNORMAL LOW (ref 13.0–17.0)
MCH: 33.9 pg (ref 26.0–34.0)
MCHC: 34.2 g/dL (ref 30.0–36.0)
MCV: 99.1 fL (ref 78.0–100.0)
PLATELETS: 88 10*3/uL — AB (ref 150–400)
RBC: 3.22 MIL/uL — ABNORMAL LOW (ref 4.22–5.81)
RDW: 16 % — AB (ref 11.5–15.5)
WBC: 8.2 10*3/uL (ref 4.0–10.5)

## 2014-01-25 LAB — GLUCOSE, CAPILLARY: Glucose-Capillary: 102 mg/dL — ABNORMAL HIGH (ref 70–99)

## 2014-01-25 LAB — T4, FREE: FREE T4: 0.93 ng/dL (ref 0.80–1.80)

## 2014-01-25 LAB — LACTATE DEHYDROGENASE: LDH: 170 U/L (ref 94–250)

## 2014-01-25 LAB — HEMOGLOBIN AND HEMATOCRIT, BLOOD
HCT: 34.3 % — ABNORMAL LOW (ref 39.0–52.0)
Hemoglobin: 11.9 g/dL — ABNORMAL LOW (ref 13.0–17.0)

## 2014-01-25 LAB — PHOSPHORUS: Phosphorus: 3.4 mg/dL (ref 2.3–4.6)

## 2014-01-25 LAB — T3, FREE: T3 FREE: 1.3 pg/mL — AB (ref 2.3–4.2)

## 2014-01-25 LAB — MAGNESIUM: Magnesium: 1.7 mg/dL (ref 1.5–2.5)

## 2014-01-25 LAB — TSH: TSH: 6.46 u[IU]/mL — AB (ref 0.350–4.500)

## 2014-01-25 MED ORDER — FUROSEMIDE 40 MG PO TABS: 40.0000 mg | ORAL_TABLET | Freq: Every day | ORAL | Status: DC

## 2014-01-25 MED ORDER — FUROSEMIDE 40 MG PO TABS
40.0000 mg | ORAL_TABLET | Freq: Every day | ORAL | Status: DC
Start: 2014-01-25 — End: 2014-01-27

## 2014-01-25 MED ORDER — SPIRONOLACTONE 50 MG PO TABS
50.0000 mg | ORAL_TABLET | Freq: Two times a day (BID) | ORAL | Status: DC
  Filled 2014-01-25: qty 1

## 2014-01-25 MED ORDER — MAGNESIUM SULFATE 40 MG/ML IJ SOLN
2.0000 g | Freq: Once | INTRAMUSCULAR | Status: AC
  Administered 2014-01-25: 2 g via INTRAVENOUS
  Filled 2014-01-25: qty 50

## 2014-01-25 MED ORDER — SPIRONOLACTONE 50 MG PO TABS: 50.0000 mg | ORAL_TABLET | Freq: Two times a day (BID) | ORAL | Status: DC

## 2014-01-25 MED ORDER — SULFAMETHOXAZOLE-TMP DS 800-160 MG PO TABS: 1.0000 | ORAL_TABLET | Freq: Every day | ORAL | Status: AC

## 2014-01-25 MED ORDER — SULFAMETHOXAZOLE-TMP DS 800-160 MG PO TABS
1.0000 | ORAL_TABLET | Freq: Every day | ORAL | Status: DC
  Administered 2014-01-25: 1 via ORAL
  Filled 2014-01-25: qty 1

## 2014-01-25 NOTE — Discharge Summary (Addendum)
Physician Discharge Summary  TREVAUGHN SCHEAR WNU:[id number] DOB: 06/30/1931 DOA: 01/23/2014  PCP: Dorrene German, MD  Admit date: 01/23/2014 Discharge date: 01/25/2014  Recommendations for Outpatient Follow-up:  1. Repeat CBC and BMP in 1 week 2. F/u with Dr. Arlyce Dice within 1-2 weeks to reschedule endoscopy to evaluate for varices 3. Restarted him on antibiotic prophylaxis for SBP 4. Lasix and spironolactone increased 5. Repeat CXR in 1-2 weeks  Discharge Diagnoses:  Principal Problem:   Pleural effusion on left Active Problems:   DM type 2 (diabetes mellitus, type 2)   Thrombocytopenia   Cirrhosis of liver   Rib fractures   Liver mass   Splenic laceration   Hydrothorax   Discharge Condition: stable, improved  Diet recommendation: regular with supplements  Wt Readings from Last 3 Encounters:  01/24/14 62 kg (136 lb 11 oz)  12/27/13 64.411 kg (142 lb)  09/20/13 64.864 kg (143 lb)    History of present illness:   The patient is an 80 yo M with DM, HTN, cryptogenic cirrhosis, esophageal varices with bleeding, portal hypertensive gastropathy, CKD, CVA, CAD s/p MI, who presented with fall.  The patient had a mechanical fall in the bathroom. He had severe pain in his left chest after the fall and was unable to get to the phone to call for help. UA on the floor for approximately a day before someone found him. CT scan of the chest was done in the emergency department which showed multiple left rib fractures and a large left pleural effusion. There is also the suggestion of a liver mass. He was admitted to the hospitalist service for rib fractures, pleural effusion and possible liver mass.  Hospital Course:   Mechanical fall, patient endorses lower extremity weakness.  He refused to even be evaluated by physical and occupational therapy in the hospital.  Recommended SNF for rehabilitation, but he declined.  He also declined home health PT/OT/aid/RN and SW.  Asked him to consider  having 24 hour aids to assist him or at least set up a home alert system (necklace/bracelet) so that if he had another fall, he could get immediate assistance.    Multiple rib fractures. He remained on room air and was able to breathe fairly comfortably with some oral pain medication. He was given incentive spirometry and advised to use it frequently.  Left large transudative pleural effusion likely due to hepatic hydrothorax.  He underwent diagnostic and therapeutic thoracentesis on 10/22. The hematocrit from the pleural fluid was 3.5 below threshold for hemothorax. He did not need followup chest tube. Repeat chest x-ray looked improved. The patient remained on room air and his diuretics were increased.    Splenic laceration, hemoglobin remained approximately stable near 12mg /dl. The patient did not have significant abdominal pain or distention.  Cryptogenic cirrhosis, history of SBP. The patient is on beta blocker, diuretics, was restarted on SBP prophylaxis with bactrim DS once daily.   Abnormal appearing gallbladder. Chest CT was concerning for a liver mass. Followup CT scan of the abdomen and pelvis demonstrated a very abnormal-looking gallbladder without evidence of acute cholecystitis are clear malignancy. His AFP has been followed by gastroenterology and has remained low. According to gastroenterology, they have noted that his gallbladder is secured somewhat unusual for a long time and looks unchanged from prior.  Thrombocytopenia, secondary to cirrhosis.  Followup per gastroenterology.  Hyponatremia, secondary to cirrhosis, a sodium stable around 133. Patient asymptomatic.  Consultants:  Gastroenterology  General surgery  PCCM  Radiology for thoracentesis  Procedures:  CT chest CT abdomen and pelvis  Chest x-ray  ultrasound-guided thoracentesis of the left pleural effusion  Antibiotics:  None   Discharge Exam: Filed Vitals:   01/25/14 0451  BP: 102/60  Pulse: 72  Temp: 97.9  F (36.6 C)  Resp: 16   Filed Vitals:   01/24/14 1446 01/24/14 1710 01/24/14 2123 01/25/14 0451  BP: 101/42 101/57 90/54 102/60  Pulse:  70 76 72  Temp:  98.1 F (36.7 C) 98.5 F (36.9 C) 97.9 F (36.6 C)  TempSrc:  Oral Oral Oral  Resp:  16 16 16   Height:      Weight:      SpO2:  98% 95% 95%    General: cachectic white male,No acute distress  HEENT: NCAT, MMM  Cardiovascular: RRR, nl S1, S2 no mrg, 2+ pulses, warm extremities  Respiratory: diminished breath sounds to the midback on the left side, no focal rales, rhonchi, or wheeze , no increased WOB  Abdomen: NABS, soft, NT/ND  MSK: decreased tone and bulk, no LEE  Neuro: Grossly intact   Discharge Instructions      Discharge Instructions   Call MD for:  difficulty breathing, headache or visual disturbances    Complete by:  As directed      Call MD for:  extreme fatigue    Complete by:  As directed      Call MD for:  hives    Complete by:  As directed      Call MD for:  persistant dizziness or light-headedness    Complete by:  As directed      Call MD for:  persistant nausea and vomiting    Complete by:  As directed      Call MD for:  redness, tenderness, or signs of infection (pain, swelling, redness, odor or green/yellow discharge around incision site)    Complete by:  As directed      Call MD for:  severe uncontrolled pain    Complete by:  As directed      Call MD for:  temperature >100.4    Complete by:  As directed      Diet general    Complete by:  As directed      Discharge instructions    Complete by:  As directed   You were hospitalized with a fall which caused some left rib fractures and an injury to your spleen.  You had fluid removed from around your left lung.  There is still some fluid left there which may make you feel Nezar Buckles of breath.  If you have increasing shortness of breath, please return to the hospital.  To prevent you from having worsening fluid around that lung your lasix and spironolactone  medications were increased.  To prevent infection, please take the antibiotic bactrim once a day.  You will need bloodwork done in 1 week after making these changes to your medications and a repeat chest x-ray in a few weeks to make sure the fluid is getting better from around your lung.  Please think about having a home health aid and nurse check on you and talk to your gastroenterologist about whether you would qualify for hospice care.  You no longer have diabetes.  Eat what foods you want and you can stop taking insulin.     Increase activity slowly    Complete by:  As directed             Medication List  STOP taking these medications       insulin aspart 100 UNIT/ML injection  Commonly known as:  novoLOG      TAKE these medications       furosemide 40 MG tablet  Commonly known as:  LASIX  Take 1 tablet (40 mg total) by mouth daily.     lactulose 10 GM/15ML solution  Commonly known as:  CHRONULAC  Take 30 mLs (20 g total) by mouth daily.     nadolol 40 MG tablet  Commonly known as:  CORGARD  Take 40 mg by mouth every morning.     pantoprazole 40 MG tablet  Commonly known as:  PROTONIX  Take 40 mg by mouth 2 (two) times daily.     spironolactone 50 MG tablet  Commonly known as:  ALDACTONE  Take 1 tablet (50 mg total) by mouth 2 (two) times daily.     sulfamethoxazole-trimethoprim 800-160 MG per tablet  Commonly known as:  BACTRIM DS  Take 1 tablet by mouth daily.     traZODone 50 MG tablet  Commonly known as:  DESYREL  Take 50 mg by mouth at bedtime.       Follow-up Information   Follow up with AVBUERE,EDWIN A, MD. Schedule an appointment as soon as possible for a visit in 1 week.   Specialty:  Internal Medicine   Contact information:   [address] [place] [address] [phone]       Follow up with [name], MD. Schedule an appointment as soon as possible for a visit in 1 week.   Specialty:  Gastroenterology   Contact information:    [address] [place] [address] [phone]       The results of significant diagnostics from this hospitalization (including imaging, microbiology, ancillary and laboratory) are listed below for reference.    Significant Diagnostic Studies: Dg Chest 1 View  01/24/2014   CLINICAL DATA:  Status post left thoracentesis.  EXAM: CHEST - 1 VIEW  COMPARISON:  CT chest 01/24/2014 and 5:23 a.m.  FINDINGS: Left pleural effusion is decreased after thoracentesis. No pneumothorax is identified. Small right effusion is noted. Left worse than right atelectatic change is identified. Heart size is normal.  IMPRESSION: Decreased left pleural effusion after thoracentesis. Negative for pneumothorax.   Electronically Signed   By: Drusilla Kanner M.D.   On: 01/24/2014 15:18   Dg Ribs Unilateral W/chest Left  01/24/2014   CLINICAL DATA:  Patient fell 3 days ago. Severe upper and lower back pain. Left posterior rib pain.  EXAM: LEFT RIBS AND CHEST - 3+ VIEW  COMPARISON:  08/02/2005  FINDINGS: There is a moderate size left pleural effusion with infiltration throughout the left lung. Shallow inspiration. Normal heart size and pulmonary vascularity. Postoperative change in the mediastinum. No pneumothorax identified.  Multiple left rib fractures are identified, appearing acute. Fractures involve the lateral left sixth, seventh, and eighth ribs.  IMPRESSION: Multiple acute left rib fractures with associated left pleural effusion and atelectasis or infiltration in the left lung.   Electronically Signed   By: Burman Nieves M.D.   On: 01/24/2014 03:07   Dg Thoracic Spine W/swimmers  01/24/2014   CLINICAL DATA:  Larey Seat 3 days ago with severe upper and lower left Back pain.  EXAM: THORACIC SPINE - 2 VIEW + SWIMMERS  COMPARISON:  Chest 08/02/2005  FINDINGS: Degenerative changes throughout the thoracic spine. No vertebral compression deformities. No paraspinal soft tissue swelling.  IMPRESSION: Degenerative changes in  the  thoracic spine. No displaced fractures identified.   Electronically Signed   By: Burman Nieves M.D.   On: 01/24/2014 03:08   Dg Lumbar Spine Complete  01/24/2014   CLINICAL DATA:  Patient fell 3 days ago with severe lower back pain.  EXAM: LUMBAR SPINE - COMPLETE 4+ VIEW  COMPARISON:  None.  FINDINGS: Degenerative changes throughout the lumbar spine. Narrowed lumbar interspaces and associated endplate hypertrophic changes. Normal alignment of the facet joints. Normal alignment of the lumbar vertebrae. No vertebral compression deformities. Vascular calcifications in the abdominal aorta. Calcification in the right upper quadrant consistent with gallstone. Gas-filled bowel loops without distention, suggesting mild ileus.  IMPRESSION: Degenerative changes in the lumbar spine. No displaced acute fractures demonstrated.   Electronically Signed   By: Burman Nieves M.D.   On: 01/24/2014 03:09   Ct Head Wo Contrast  01/24/2014   CLINICAL DATA:  Patient fell 3 days ago, striking head. No loss of consciousness.  EXAM: CT HEAD WITHOUT CONTRAST  CT CERVICAL SPINE WITHOUT CONTRAST  TECHNIQUE: Multidetector CT imaging of the head and cervical spine was performed following the standard protocol without intravenous contrast. Multiplanar CT image reconstructions of the cervical spine were also generated.  COMPARISON:  07/20/2011  FINDINGS: CT HEAD FINDINGS  Diffuse cerebral atrophy. Ventricular dilatation consistent with central atrophy. Low-attenuation changes in the deep white matter consistent with small vessel ischemia. Old white matter infarcts in the right anterior frontal region. Vascular calcifications. No mass effect or midline shift. No abnormal extra-axial fluid collections. Gray-white matter junctions are distinct. Basal cisterns are not effaced. No evidence of acute intracranial hemorrhage. No depressed skull fractures. Visualized paranasal sinuses and mastoid air cells are not opacified.  CT CERVICAL  SPINE FINDINGS  Diffuse bone demineralization. Normal alignment of the cervical vertebrae. Diffuse degenerative changes throughout the cervical spine with narrowed cervical interspaces and endplate hypertrophic changes. Prominent posterior disc osteophyte complex at C3-4. Normal alignment of the facet joints. Degenerative changes throughout the facet joints. No vertebral compression deformities. No prevertebral soft tissue swelling. No focal bone lesion or bone destruction. Vascular calcifications in the cervical carotid arteries. Soft tissues are unremarkable. Large left pleural effusion. Fibrosis or edema demonstrated in the lung apices.  IMPRESSION: No acute intracranial abnormalities. Diffuse cerebral atrophy and small vessel ischemic changes. 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  Diffuse degenerative changes and demineralization in the cervical spine. Normal alignment. No displaced fractures identified.   Electronically Signed   By: Burman Nieves M.D.   On: 01/24/2014 03:14   Ct Chest Wo Contrast  01/24/2014   CLINICAL DATA:  Patient fell 3 days ago.  Left rib pain.  EXAM: CT CHEST WITHOUT CONTRAST  TECHNIQUE: Multidetector CT imaging of the chest was performed following the standard protocol without IV contrast.  COMPARISON:  01/14/2005  FINDINGS: Large left pleural effusion with consolidation or atelectasis throughout the left lung. Small right pleural effusion with right basilar atelectasis. Appears to be diffuse underlying peripheral fibrosis consistent with chronic usual interstitial pneumonitis. The heart size is normal. Normal caliber thoracic aorta. Postoperative changes with bypass grafting. Native Coronary artery and aortic calcification. No significant lymphadenopathy in the chest. Airways appear patent. Multiple left rib fractures with rib fractures demonstrated in the left sixth, seventh, eighth, and ninth ribs. No pneumothorax. Degenerative changes in the thoracic spine. No  vertebral compression deformities.  Included portions of the upper abdominal organs demonstrate what appears to be a low-attenuation mass in the liver, measuring about 7.7  cm in diameter. This was not present on the previous study. It is incompletely included on the current study. Developing mass or metastatic disease is suspected. There is diffuse upper abdominal ascites which is also new since the previous study.  IMPRESSION: Multiple left rib fractures with large left pleural effusion and atelectasis/ consolidation throughout the left lung. Small right pleural effusion with mild basilar atelectasis. Underlying interstitial fibrosis in the lungs. No pneumothorax. Incidental note of what appears to be a large mass in the liver. Primary or secondary malignancy is suspected. Upper abdominal ascites is also demonstrated.   Electronically Signed   By: Burman Nieves M.D.   On: 01/24/2014 05:46   Ct Cervical Spine Wo Contrast  01/24/2014   CLINICAL DATA:  Patient fell 3 days ago, striking head. No loss of consciousness.  EXAM: CT HEAD WITHOUT CONTRAST  CT CERVICAL SPINE WITHOUT CONTRAST  TECHNIQUE: Multidetector CT imaging of the head and cervical spine was performed following the standard protocol without intravenous contrast. Multiplanar CT image reconstructions of the cervical spine were also generated.  COMPARISON:  07/20/2011  FINDINGS: CT HEAD FINDINGS  Diffuse cerebral atrophy. Ventricular dilatation consistent with central atrophy. Low-attenuation changes in the deep white matter consistent with small vessel ischemia. Old white matter infarcts in the right anterior frontal region. Vascular calcifications. No mass effect or midline shift. No abnormal extra-axial fluid collections. Gray-white matter junctions are distinct. Basal cisterns are not effaced. No evidence of acute intracranial hemorrhage. No depressed skull fractures. Visualized paranasal sinuses and mastoid air cells are not opacified.  CT  CERVICAL SPINE FINDINGS  Diffuse bone demineralization. Normal alignment of the cervical vertebrae. Diffuse degenerative changes throughout the cervical spine with narrowed cervical interspaces and endplate hypertrophic changes. Prominent posterior disc osteophyte complex at C3-4. Normal alignment of the facet joints. Degenerative changes throughout the facet joints. No vertebral compression deformities. No prevertebral soft tissue swelling. No focal bone lesion or bone destruction. Vascular calcifications in the cervical carotid arteries. Soft tissues are unremarkable. Large left pleural effusion. Fibrosis or edema demonstrated in the lung apices.  IMPRESSION: No acute intracranial abnormalities. Diffuse cerebral atrophy and small vessel ischemic changes. 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  Diffuse degenerative changes and demineralization in the cervical spine. Normal alignment. No displaced fractures identified.   Electronically Signed   By: Burman Nieves M.D.   On: 01/24/2014 03:14   Ct Abdomen Pelvis W Contrast  01/24/2014   CLINICAL DATA:  Hepatic cirrhosis. Esophageal varices with bleeding previously. Prior myocardial infarction. Portal hypertension causing gas trough of the. Chronic kidney disease. The patient fell in the bathroom 3 days ago and was not able to get up until helped the next day. Continued chest pain after the fall. A recent chest CT showed multiple left rib fractures, large left pleural effusion, and a possible mass in the liver. We are further working up that mass.  EXAM: CT ABDOMEN AND PELVIS WITH CONTRAST  TECHNIQUE: Multidetector CT imaging of the abdomen and pelvis was performed using the standard protocol following bolus administration of intravenous contrast.  CONTRAST:  80mL OMNIPAQUE IOHEXOL 300 MG/ML  SOLN  COMPARISON:  01/24/2014; 03/28/2012  FINDINGS: Lower chest: Left posterior eighth and ninth rib fractures with large left pleural effusion and atelectasis  of the left lower lobe noted. Dependent density in the left pleural space suggesting hemothorax. Relatively nondisplaced left anterior rib fractures are also noted. Small right pleural effusion with passive atelectasis. Aortic and mitral calcification along with coronary  artery atherosclerotic calcification. Peripheral interstitial accentuation in the lung bases.  Hepatobiliary: As best I can tell the cystic "mass" along the right hepatic lobe actually represents a distended and tortuous gallbladder and cystic duct. It appears to contain the previously seen gallstones along its inferior portions. The common bile duct does not appear dilated. However, there is thrombus in the superior mesenteric vein, portal vein, and portal venous tributaries. No hepatic vein thrombosis is observed. Hepatic cirrhosis noted. Extensive portosystemic varices.  Pancreas: Intact  Spleen: Small peripheral laceration in the upper margin of the spleen, images 24-27 of series 3.  Adrenals/Urinary Tract: Left kidney lower pole benign appearing cyst, 2.0 by 1.7 cm. The ureters are difficult to follow due to all of the surrounding edema but no calculi are visualized. Right bladder cellule.  Stomach/Bowel: Antral wall thickening in the stomach. Wall thickening in the bowel probably due to hypoalbuminemia.  Vascular/Lymphatic: Prominent aortoiliac atherosclerosis. Reactive porta hepatis lymph nodes.  Reproductive: Calcifications along the margins of the central zone of the prostate gland.  Other: Extensive ascites and mesenteric edema.  Musculoskeletal: Lower lumbar spondylosis. Rib findings as detailed above in the chest section.  IMPRESSION: 1. The cystic mass appears to represent a dilated and tortuous cystic duct and gallbladder and contains the gallstones which were previously clearly within the gallbladder. I doubt that this is a choledochal cyst since there is no choledochal cyst back on 03/28/2012. Occasionally a cystic lymphangioma can  occur adjacent to the gallbladder in give a similar appearance. The gallbladder is clearly abnormal and distended with infolding is causing a septated appearance, and in combination with the patient's acute posttraumatic clinical problems and comorbidities, the relative merits of gallbladder removal should be considered. 2. Small peripheral laceration along the superior spleen, grade 1. 3. 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 4. Cirrhosis with extensive mesenteric edema and ascites. Extensive portosystemic varices. 5. Left lower anterior and posterior rib fractures with large left pleural effusion. Small right pleural effusion. 6. Gastric and bowel wall thickening probably from hypoalbuminemia. 7. Atherosclerosis. 8. Extensive   Electronically Signed   By: Herbie Baltimore M.D.   On: 01/24/2014 09:18   US Thoracentesis Asp Pleural Space W/img Guide  01/24/2014   INDICATION: Symptomatic left sided pleural effusion, recent fall with rib fractures and recent liver mass found on imaging.  EXAM: US THORACENTESIS ASP PLEURAL SPACE W/IMG GUIDE  COMPARISON:  None.  MEDICATIONS: None  COMPLICATIONS: None immediate  TECHNIQUE: Informed written consent was obtained from the patient after a discussion of the risks, benefits and alternatives to treatment. A timeout was performed prior to the initiation of the procedure.  Initial ultrasound scanning demonstrates a left pleural effusion. The lower chest was prepped and draped in the usual sterile fashion. 1% lidocaine was used for local anesthesia.  Under direct ultrasound guidance, a 19 gauge, 7-cm, Yueh catheter was introduced. An ultrasound image was saved for documentation purposes. The thoracentesis was performed. The catheter was removed and a dressing was applied. The patient tolerated the procedure well without immediate post procedural complication. The patient was escorted to have an upright chest radiograph.  FINDINGS: A total of approximately 1  liters of amber/blood tinged fluid was removed. Requested samples were sent to the laboratory.  IMPRESSION: Successful ultrasound-guided left sided thoracentesis yielding 1 liters of pleural fluid.  Read By:  Pattricia Boss PA-C   Electronically Signed   By: Gilmer Mor D.O.   On: 01/24/2014 15:51    Microbiology: Recent Results (  from the past 240 hour(s))  BODY FLUID CULTURE     Status: None   Collection Time    01/24/14  2:45 PM      Result Value Ref Range Status   Specimen Description PLEURAL   Final   Special Requests Normal   Final   Gram Stain     Final   Value: NO WBC SEEN     NO ORGANISMS SEEN     Performed at [facility]   Culture PENDING   Incomplete   Report Status PENDING   Incomplete     Labs: Basic Metabolic Panel:  Recent Labs Lab 01/24/14 0206 01/25/14 0352  NA 132* 133*  K 4.2 3.9  CL 95* 95*  CO2 25 26  GLUCOSE 162* 105*  BUN 14 18  CREATININE 1.01 1.01  CALCIUM 8.2* 8.3*  MG  --  1.7  PHOS  --  3.4   Liver Function Tests:  Recent Labs Lab 01/24/14 0206 01/25/14 0352  AST 33 26  ALT 12 10  ALKPHOS 160* 131*  BILITOT 2.9* 3.0*  PROT 6.8 6.1  ALBUMIN 2.0* 1.8*   No results found for this basename: LIPASE, AMYLASE,  in the last 168 hours  Recent Labs Lab 01/24/14 0206  AMMONIA 30   CBC:  Recent Labs Lab 01/24/14 0206 01/25/14 0352 01/25/14 1030  WBC 12.7* 8.2  --   NEUTROABS 9.8*  --   --   HGB 12.3* 10.9* 11.9*  HCT 35.6* 31.9* 34.3*  MCV 100.3* 99.1  --   PLT 110* 88*  --    Cardiac Enzymes:  Recent Labs Lab 01/24/14 0206  CKTOTAL 27   BNP: BNP (last 3 results) No results found for this basename: PROBNP,  in the last 8760 hours CBG:  Recent Labs Lab 01/24/14 0854 01/24/14 1153 01/24/14 1707 01/24/14 2121 01/25/14 0857  GLUCAP 119* 113* 108* 122* 102*    Time coordinating discharge: 45 minutes  Signed:  Nivea Wojdyla  [facility] 01/25/2014, 11:28 AM

## 2014-01-25 NOTE — Care Management Note (Signed)
CARE MANAGEMENT NOTE 01/25/2014  Patient:  Alex Wise, Alex Wise   Account Number:  [id number]  Date Initiated:  01/24/2014  Documentation initiated by:  Sandford Craze  Subjective/Objective Assessment:   80 yo admitted with fall/liver mass/pleural effusion     Action/Plan:   From home alone.  Pt gets meals on wheels.   Anticipated DC Date:  01/25/2014   Anticipated DC Plan:  HOME W HOME HEALTH SERVICES      DC Planning Services  CM consult      [facility] Choice  HOME HEALTH   Choice offered to / List presented to:  C-1 Patient        HH arranged  HH-1 RN  HH-2 PT  HH-3 OT  HH-4 NURSE'S AIDE  HH-6 SOCIAL WORKER      HH agency  [facility]   Status of service:  In process, will continue to follow Medicare Important Message given?   (If response is "NO", the following Medicare IM given date fields will be blank) Date Medicare IM given:   Medicare IM given by:   Date Additional Medicare IM given:   Additional Medicare IM given by:    Discharge Disposition:    Per UR Regulation:  Reviewed for med. necessity/level of care/duration of stay  If discussed at Long Length of Stay Meetings, dates discussed:    Comments:  01/25/14 Sandford Craze RN,BSN,NCM [phone] Pt to dc home today.  MD orders for HHPT/OT/RN/Aide/SW.  Pt states that he has a cane and a walker at home already and declines any additional equipment.  Pt offered choice for [facility] services and he chose [facility].  Pt also given information and coupons for a home emergency device.  Pt appreciative of CM involvement.  01/24/14 Sandford Craze RN,BSN,NCM [phone] Chart reviewed and CM following for DC needs.  Pt could potentially benefit from PT/OT consult to prepare for safe DC plan.

## 2014-01-25 NOTE — Evaluation (Signed)
Physical Therapy Evaluation Patient Details Name: Alex Wise MRN: [id number] DOB: 1931/07/14 Today's Date: 01/25/2014   History of Present Illness  The patient is an 80 yo M adm after having a mechanical fall in the bathroom at home. He had severe pain in his left chest after the fall and was unable to get to the phone to call for help.  He was found to have  multiple L  rib fxs  and pleural effusion; PMHx: DM, HTN, cryptogenic cirrhosis, esophageal varices with bleeding, portal hypertensive gastropathy, CKD, CVA, CAD s/p MI   Clinical Impression  Pt is being D/C'd today and refuses  All post acute services, HHPT or SNF; He really needs SNF and is certainly at risk for falls and re-admission; He reports he is going to go home and "stay on the couch"    Follow Up Recommendations SNF (pt needs SNF but he is refusing; also refuses HHPT)    Equipment Recommendations       Recommendations for Other Services       Precautions / Restrictions Precautions Precautions: Fall Restrictions Other Position/Activity Restrictions: The patient is an 80 yo M adm after having a mechanical fall in the bathroom at home. He had severe pain in his left chest after the fall and was unable to get to the phone to call for help.  He was found to have  multiple L  rib fxs  and pleural effusion; PMHx: DM, HTN, cryptogenic cirrhosis, esophageal varices with bleeding, portal hypertensive gastropathy, CKD, CVA, CAD s/p MI       Mobility  Bed Mobility Overal bed mobility: Needs Assistance Bed Mobility: Rolling;Sidelying to Sit Rolling: Min assist Sidelying to sit: Mod assist       General bed mobility comments: incr time, cues for sequencing, mod assist to bring trunk to upright; min assist  for return to s/l adn supine, assist with LEs  Transfers Overall transfer level: Needs assistance Equipment used: Rolling walker (2 wheeled) Transfers: Sit to/from Stand Sit to Stand: Min assist         General  transfer comment: incr time, multimodal cues for hand placement  Ambulation/Gait Ambulation/Gait assistance: Min assist;Mod assist Ambulation Distance (Feet): 35 Feet Assistive device: Rolling walker (2 wheeled) Gait Pattern/deviations: Step-through pattern;Trunk flexed;Decreased stride length     General Gait Details: pt requires cues for RW position and distance from self, posture; requires assist to maneuver walker and avoid running into objects and people in hallway  Stairs            Wheelchair Mobility    Modified Rankin (Stroke Patients Only)       Balance Overall balance assessment: Needs assistance         Standing balance support: Bilateral upper extremity supported Standing balance-Leahy Scale: Poor                               Pertinent Vitals/Pain Pain Assessment: Faces Faces Pain Scale: Hurts even more Pain Location: ribs Pain Intervention(s): Limited activity within patient's tolerance;Monitored during session    Home Living Family/patient expects to be discharged to:: Private residence Living Arrangements: Alone   Type of Home: House Home Access: Stairs to enter   [employer] of Steps: 2 Home Layout: One level Home Equipment: Cane - single point;Walker - 2 wheels      Prior Function           Comments: pt has a  friend that checks on home; He is not very clear about his prior functional status; He does state that he stays on the sofa "a lot" and that he sleeps there some of the time     Hand Dominance        Extremity/Trunk Assessment   Upper Extremity Assessment: Defer to OT evaluation           Lower Extremity Assessment: Generalized weakness         Communication   Communication: No difficulties  Cognition Arousal/Alertness: Awake/alert Behavior During Therapy: WFL for tasks assessed/performed Overall Cognitive Status: Difficult to assess Area of Impairment: Problem  solving;Safety/judgement         Safety/Judgement: Decreased awareness of safety;Decreased awareness of deficits   Problem Solving: Requires verbal cues;Slow processing      General Comments      Exercises        Assessment/Plan    PT Assessment Patient needs continued PT services  PT Diagnosis Difficulty walking;Generalized weakness   PT Problem List Decreased activity tolerance;Decreased balance;Decreased mobility;Decreased strength;Decreased safety awareness  PT Treatment Interventions DME instruction;Gait training;Functional mobility training;Therapeutic exercise;Therapeutic activities;Patient/family education   PT Goals (Current goals can be found in the Care Plan section) Acute Rehab PT Goals Patient Stated Goal: pt wants to go home Potential to Achieve Goals: Fair    Frequency Min 3X/week   Barriers to discharge        Co-evaluation               End of Session Equipment Utilized During Treatment: Gait belt Activity Tolerance: Patient limited by fatigue Patient left: in bed;with call bell/phone within reach;with family/visitor present Nurse Communication: Mobility status    Functional Assessment Tool Used: clinicla judgement Functional Limitation: Mobility: Walking and moving around Mobility: Walking and Moving Around Current Status ([id number]): At least 20 percent but less than 40 percent impaired, limited or restricted Mobility: Walking and Moving Around Goal Status [id number](G8979): At least 1 percent but less than 20 percent impaired, limited or restricted    Time: [id number] PT Time Calculation (min): 33 min   Charges:   PT Evaluation $Initial PT Evaluation Tier I: 1 Procedure PT Treatments $Gait Training: 23-37 mins   PT G Codes:   Functional Assessment Tool Used: clinicla judgement Functional Limitation: Mobility: Walking and moving around    Gastro Care LLCWILLIAMS,Alex Sexson 01/25/2014, 12:18 PM

## 2014-01-25 NOTE — Progress Notes (Addendum)
Clinical Social Work Department BRIEF PSYCHOSOCIAL ASSESSMENT 01/25/2014  Patient:  Alex Wise, Alex Wise     Account Number:  [id number]     Admit date:  01/23/2014  Clinical Social Worker:  Ulyess Blossom  Date/Time:  01/25/2014 12:10 PM  Referred by:  Physician  Date Referred:  01/25/2014 Referred for  Transportation assistance   Other Referral:   per RN pt needing ambulance transport home   Interview type:  Patient Other interview type:   and patient friend at bedside    PSYCHOSOCIAL DATA Living Status:  ALONE Admitted from facility:   Level of care:   Primary support name:  Alex Wise/son/[phone] Primary support relationship to patient:  CHILD, ADULT Degree of support available:   adequate    CURRENT CONCERNS Current Concerns  Other - See comment   Other Concerns:   ambulance transport home    [place] / [facility] CSW received referral for ambulance transport needs.    Per chart review, pt declining SNF for rehab.    CSW and RNCM met with pt at bedside. Pt friend present at this time. Introduced self and explained role. Pt confirmed need for ambulance transport home. CSW confirmed address. CSW discussed with pt and pt friend about having someone present at pt home when pt arrives. Pt and pt friend arranged for someone to present.    RNCM spoke with pt re: [place] needs.    CSW arranged ambulance transport for pt to home via [place].    No further social work needs identified at this time.    CSW signing off.   Assessment/plan status:  No Further Intervention Required Other assessment/ plan:   Information/referral to community resources:   [facility] for [facility] needs. PTAR for ambulance transport    PATIENT'S/FAMILY'S RESPONSE TO PLAN OF CARE: Pt alert and oriented x 4. Pt declining SNF. Agreeable to [facility]. Expressed need for ambulance transport home. Transport arranged and pt friend arranged for someone to be present at pt home upon pt arrival.     [name], MSW, [place] [facility] [phone]

## 2014-01-25 NOTE — Progress Notes (Signed)
OT Cancellation Note  Patient Details Name: Alex Wise MRN: [id number] DOB: 1931-04-21   Cancelled Treatment:    Reason Eval/Treat Not Completed: Other (comment).  Noted MD checking for hemothorax & EGD pending.  Will check back.  Athalene Kolle 01/25/2014, 8:13 AM Marica Otter, OTR/L [phone] 01/25/2014

## 2014-01-25 NOTE — Progress Notes (Signed)
PT Cancellation Note  Patient Details Name: Alex Wise MRN: [id number] DOB: January 15, 1932   Cancelled Treatment:    Reason Eval/Treat Not Completed: Medical issues which prohibited therapy; Noted MD checking for hemothorax & EGD pending. Will check back as schedule     and medical issues permit. Drucilla Chalet, PT Pager: [phone] 01/25/2014      Drucilla Chalet 01/25/2014, 8:36 AM

## 2014-01-25 NOTE — Plan of Care (Signed)
Problem: Discharge Progression Outcomes Goal: Discharge plan in place and appropriate Outcome: Completed/Met Date Met:  01/25/14 Pt discharged to home via ambulance

## 2014-01-25 NOTE — Progress Notes (Signed)
Discharge instructions explained to pt. He states a friend comes over to help him.

## 2014-01-25 NOTE — Progress Notes (Signed)
PULMONARY / CRITICAL CARE MEDICINE   Name: Alex Wise MRN: [id number] DOB: 31-Oct-1931    ADMISSION DATE:  01/23/2014 CONSULTATION DATE:  01/24/14  REFERRING MD :  TRiad  CHIEF COMPLAINT:  LEft pleural effusion   BRIEF Patient had a fall in the bathroom 3 days ago. He was not able to get up and was on the floor until next morning until "meals on wheels" arrived. He refused hospital care at the time. He continued to have chest pain after the fall. His family had convince him to to come to [facility]. He has hx of non-alcoholic cirrhosis with hx of variceal bleeding followed by Dr. Arlyce DiceKaplan. CT of the chest was done showing multiple rib fractures Left>right pleural effusion and liver mass measuring 7 cm. Patient denies any SOB, no fever or chills.  In addition CT chest showed findings of ILD   SIGNIFICANT EVENTS: 01/23/2014 - admiot 01/24/14 - consult pulmonary    SUBJECTIVE/OVERNIGHT/INTERVAL HX 01/25/14 - Fluid is a transudate (surprisingly)  By LDH and protein and albumin criteria. No hemothorax     VITAL SIGNS: Temp:  [97.9 F (36.6 C)-98.5 F (36.9 C)] 97.9 F (36.6 C) (10/23 0451) Pulse Rate:  [70-76] 72 (10/23 0451) Resp:  [16] 16 (10/23 0451) BP: (89-102)/(38-60) 102/60 mmHg (10/23 0451) SpO2:  [95 %-98 %] 95 % (10/23 0451) HEMODYNAMICS:   VENTILATOR SETTINGS:   INTAKE / OUTPUT: No intake or output data in the 24 hours ending 01/25/14 0949  PHYSICAL EXAMINATION: General:  Frail elderly male. Looks well Neuro:  AxXOx3. Speech normal. No liver flap HEENT:  Supple neck Cardiovascular:  Normal heart sounds Lungs:  LEft chest tender NO LONGER  to touch and dullness and air entry has improved Abdomen:  Ascites + Musculoskeletal:  No cynaosis. No clubbing Skin:  INtact anteriorly  LABS: PULMONARY No results found for this basename: PHART, PCO2, PCO2ART, PO2, PO2ART, HCO3, TCO2, O2SAT,  in the last 168 hours  CBC  Recent Labs Lab 01/24/14 0206  01/25/14 0352  HGB 12.3* 10.9*  HCT 35.6* 31.9*  WBC 12.7* 8.2  PLT 110* 88*    COAGULATION  Recent Labs Lab 01/24/14 0206  INR 1.47    CARDIAC  No results found for this basename: TROPONINI,  in the last 168 hours No results found for this basename: PROBNP,  in the last 168 hours   CHEMISTRY  Recent Labs Lab 01/24/14 0206 01/25/14 0352  NA 132* 133*  K 4.2 3.9  CL 95* 95*  CO2 25 26  GLUCOSE 162* 105*  BUN 14 18  CREATININE 1.01 1.01  CALCIUM 8.2* 8.3*  MG  --  1.7  PHOS  --  3.4   Estimated Creatinine Clearance: 50.7 ml/min (by C-G formula based on Cr of 1.01).   LIVER  Recent Labs Lab 01/24/14 0206 01/25/14 0352  AST 33 26  ALT 12 10  ALKPHOS 160* 131*  BILITOT 2.9* 3.0*  PROT 6.8 6.1  ALBUMIN 2.0* 1.8*  INR 1.47  --      INFECTIOUS No results found for this basename: LATICACIDVEN, PROCALCITON,  in the last 168 hours   ENDOCRINE CBG (last 3)   Recent Labs  01/24/14 1707 01/24/14 2121 01/25/14 0857  GLUCAP 108* 122* 102*         IMAGING x48h Dg Chest 1 View  01/24/2014   CLINICAL DATA:  Status post left thoracentesis.  EXAM: CHEST - 1 VIEW  COMPARISON:  CT chest 01/24/2014 and 5:23 a.m.  FINDINGS:  Left pleural effusion is decreased after thoracentesis. No pneumothorax is identified. Small right effusion is noted. Left worse than right atelectatic change is identified. Heart size is normal.  IMPRESSION: Decreased left pleural effusion after thoracentesis. Negative for pneumothorax.   Electronically Signed   By: Drusilla Kanner M.D.   On: 01/24/2014 15:18   Dg Ribs Unilateral W/chest Left  01/24/2014   CLINICAL DATA:  Patient fell 3 days ago. Severe upper and lower back pain. Left posterior rib pain.  EXAM: LEFT RIBS AND CHEST - 3+ VIEW  COMPARISON:  08/02/2005  FINDINGS: There is a moderate size left pleural effusion with infiltration throughout the left lung. Shallow inspiration. Normal heart size and pulmonary vascularity.  Postoperative change in the mediastinum. No pneumothorax identified.  Multiple left rib fractures are identified, appearing acute. Fractures involve the lateral left sixth, seventh, and eighth ribs.  IMPRESSION: Multiple acute left rib fractures with associated left pleural effusion and atelectasis or infiltration in the left lung.   Electronically Signed   By: Burman Nieves M.D.   On: 01/24/2014 03:07   Dg Thoracic Spine W/swimmers  01/24/2014   CLINICAL DATA:  Larey Seat 3 days ago with severe upper and lower left Back pain.  EXAM: THORACIC SPINE - 2 VIEW + SWIMMERS  COMPARISON:  Chest 08/02/2005  FINDINGS: Degenerative changes throughout the thoracic spine. No vertebral compression deformities. No paraspinal soft tissue swelling.  IMPRESSION: Degenerative changes in the thoracic spine. No displaced fractures identified.   Electronically Signed   By: Burman Nieves M.D.   On: 01/24/2014 03:08   Dg Lumbar Spine Complete  01/24/2014   CLINICAL DATA:  Patient fell 3 days ago with severe lower back pain.  EXAM: LUMBAR SPINE - COMPLETE 4+ VIEW  COMPARISON:  None.  FINDINGS: Degenerative changes throughout the lumbar spine. Narrowed lumbar interspaces and associated endplate hypertrophic changes. Normal alignment of the facet joints. Normal alignment of the lumbar vertebrae. No vertebral compression deformities. Vascular calcifications in the abdominal aorta. Calcification in the right upper quadrant consistent with gallstone. Gas-filled bowel loops without distention, suggesting mild ileus.  IMPRESSION: Degenerative changes in the lumbar spine. No displaced acute fractures demonstrated.   Electronically Signed   By: Burman Nieves M.D.   On: 01/24/2014 03:09   Ct Head Wo Contrast  01/24/2014   CLINICAL DATA:  Patient fell 3 days ago, striking head. No loss of consciousness.  EXAM: CT HEAD WITHOUT CONTRAST  CT CERVICAL SPINE WITHOUT CONTRAST  TECHNIQUE: Multidetector CT imaging of the head and cervical  spine was performed following the standard protocol without intravenous contrast. Multiplanar CT image reconstructions of the cervical spine were also generated.  COMPARISON:  07/20/2011  FINDINGS: CT HEAD FINDINGS  Diffuse cerebral atrophy. Ventricular dilatation consistent with central atrophy. Low-attenuation changes in the deep white matter consistent with small vessel ischemia. Old white matter infarcts in the right anterior frontal region. Vascular calcifications. No mass effect or midline shift. No abnormal extra-axial fluid collections. Gray-white matter junctions are distinct. Basal cisterns are not effaced. No evidence of acute intracranial hemorrhage. No depressed skull fractures. Visualized paranasal sinuses and mastoid air cells are not opacified.  CT CERVICAL SPINE FINDINGS  Diffuse bone demineralization. Normal alignment of the cervical vertebrae. Diffuse degenerative changes throughout the cervical spine with narrowed cervical interspaces and endplate hypertrophic changes. Prominent posterior disc osteophyte complex at C3-4. Normal alignment of the facet joints. Degenerative changes throughout the facet joints. No vertebral compression deformities. No prevertebral soft tissue swelling. No focal  bone lesion or bone destruction. Vascular calcifications in the cervical carotid arteries. Soft tissues are unremarkable. Large left pleural effusion. Fibrosis or edema demonstrated in the lung apices.  IMPRESSION: No acute intracranial abnormalities. Diffuse cerebral atrophy and small vessel ischemic changes. 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  Diffuse degenerative changes and demineralization in the cervical spine. Normal alignment. No displaced fractures identified.   Electronically Signed   By: Burman Nieves M.D.   On: 01/24/2014 03:14   Ct Chest Wo Contrast  01/24/2014   CLINICAL DATA:  Patient fell 3 days ago.  Left rib pain.  EXAM: CT CHEST WITHOUT CONTRAST  TECHNIQUE:  Multidetector CT imaging of the chest was performed following the standard protocol without IV contrast.  COMPARISON:  01/14/2005  FINDINGS: Large left pleural effusion with consolidation or atelectasis throughout the left lung. Small right pleural effusion with right basilar atelectasis. Appears to be diffuse underlying peripheral fibrosis consistent with chronic usual interstitial pneumonitis. The heart size is normal. Normal caliber thoracic aorta. Postoperative changes with bypass grafting. Native Coronary artery and aortic calcification. No significant lymphadenopathy in the chest. Airways appear patent. Multiple left rib fractures with rib fractures demonstrated in the left sixth, seventh, eighth, and ninth ribs. No pneumothorax. Degenerative changes in the thoracic spine. No vertebral compression deformities.  Included portions of the upper abdominal organs demonstrate what appears to be a low-attenuation mass in the liver, measuring about 7.7 cm in diameter. This was not present on the previous study. It is incompletely included on the current study. Developing mass or metastatic disease is suspected. There is diffuse upper abdominal ascites which is also new since the previous study.  IMPRESSION: Multiple left rib fractures with large left pleural effusion and atelectasis/ consolidation throughout the left lung. Small right pleural effusion with mild basilar atelectasis. Underlying interstitial fibrosis in the lungs. No pneumothorax. Incidental note of what appears to be a large mass in the liver. Primary or secondary malignancy is suspected. Upper abdominal ascites is also demonstrated.   Electronically Signed   By: Burman Nieves M.D.   On: 01/24/2014 05:46   Ct Cervical Spine Wo Contrast  01/24/2014   CLINICAL DATA:  Patient fell 3 days ago, striking head. No loss of consciousness.  EXAM: CT HEAD WITHOUT CONTRAST  CT CERVICAL SPINE WITHOUT CONTRAST  TECHNIQUE: Multidetector CT imaging of the head  and cervical spine was performed following the standard protocol without intravenous contrast. Multiplanar CT image reconstructions of the cervical spine were also generated.  COMPARISON:  07/20/2011  FINDINGS: CT HEAD FINDINGS  Diffuse cerebral atrophy. Ventricular dilatation consistent with central atrophy. Low-attenuation changes in the deep white matter consistent with small vessel ischemia. Old white matter infarcts in the right anterior frontal region. Vascular calcifications. No mass effect or midline shift. No abnormal extra-axial fluid collections. Gray-white matter junctions are distinct. Basal cisterns are not effaced. No evidence of acute intracranial hemorrhage. No depressed skull fractures. Visualized paranasal sinuses and mastoid air cells are not opacified.  CT CERVICAL SPINE FINDINGS  Diffuse bone demineralization. Normal alignment of the cervical vertebrae. Diffuse degenerative changes throughout the cervical spine with narrowed cervical interspaces and endplate hypertrophic changes. Prominent posterior disc osteophyte complex at C3-4. Normal alignment of the facet joints. Degenerative changes throughout the facet joints. No vertebral compression deformities. No prevertebral soft tissue swelling. No focal bone lesion or bone destruction. Vascular calcifications in the cervical carotid arteries. Soft tissues are unremarkable. Large left pleural effusion. Fibrosis or edema demonstrated in  the lung apices.  IMPRESSION: No acute intracranial abnormalities. Diffuse cerebral atrophy and small vessel ischemic changes. 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  Diffuse degenerative changes and demineralization in the cervical spine. Normal alignment. No displaced fractures identified.   Electronically Signed   By: Burman Nieves M.D.   On: 01/24/2014 03:14   Ct Abdomen Pelvis W Contrast  01/24/2014   CLINICAL DATA:  Hepatic cirrhosis. Esophageal varices with bleeding previously. Prior  myocardial infarction. Portal hypertension causing gas trough of the. Chronic kidney disease. The patient fell in the bathroom 3 days ago and was not able to get up until helped the next day. Continued chest pain after the fall. A recent chest CT showed multiple left rib fractures, large left pleural effusion, and a possible mass in the liver. We are further working up that mass.  EXAM: CT ABDOMEN AND PELVIS WITH CONTRAST  TECHNIQUE: Multidetector CT imaging of the abdomen and pelvis was performed using the standard protocol following bolus administration of intravenous contrast.  CONTRAST:  80mL OMNIPAQUE IOHEXOL 300 MG/ML  SOLN  COMPARISON:  01/24/2014; 03/28/2012  FINDINGS: Lower chest: Left posterior eighth and ninth rib fractures with large left pleural effusion and atelectasis of the left lower lobe noted. Dependent density in the left pleural space suggesting hemothorax. Relatively nondisplaced left anterior rib fractures are also noted. Small right pleural effusion with passive atelectasis. Aortic and mitral calcification along with coronary artery atherosclerotic calcification. Peripheral interstitial accentuation in the lung bases.  Hepatobiliary: As best I can tell the cystic "mass" along the right hepatic lobe actually represents a distended and tortuous gallbladder and cystic duct. It appears to contain the previously seen gallstones along its inferior portions. The common bile duct does not appear dilated. However, there is thrombus in the superior mesenteric vein, portal vein, and portal venous tributaries. No hepatic vein thrombosis is observed. Hepatic cirrhosis noted. Extensive portosystemic varices.  Pancreas: Intact  Spleen: Small peripheral laceration in the upper margin of the spleen, images 24-27 of series 3.  Adrenals/Urinary Tract: Left kidney lower pole benign appearing cyst, 2.0 by 1.7 cm. The ureters are difficult to follow due to all of the surrounding edema but no calculi are  visualized. Right bladder cellule.  Stomach/Bowel: Antral wall thickening in the stomach. Wall thickening in the bowel probably due to hypoalbuminemia.  Vascular/Lymphatic: Prominent aortoiliac atherosclerosis. Reactive porta hepatis lymph nodes.  Reproductive: Calcifications along the margins of the central zone of the prostate gland.  Other: Extensive ascites and mesenteric edema.  Musculoskeletal: Lower lumbar spondylosis. Rib findings as detailed above in the chest section.  IMPRESSION: 1. The cystic mass appears to represent a dilated and tortuous cystic duct and gallbladder and contains the gallstones which were previously clearly within the gallbladder. I doubt that this is a choledochal cyst since there is no choledochal cyst back on 03/28/2012. Occasionally a cystic lymphangioma can occur adjacent to the gallbladder in give a similar appearance. The gallbladder is clearly abnormal and distended with infolding is causing a septated appearance, and in combination with the patient's acute posttraumatic clinical problems and comorbidities, the relative merits of gallbladder removal should be considered. 2. Small peripheral laceration along the superior spleen, grade 1. 3. 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 4. Cirrhosis with extensive mesenteric edema and ascites. Extensive portosystemic varices. 5. Left lower anterior and posterior rib fractures with large left pleural effusion. Small right pleural effusion. 6. Gastric and bowel wall thickening probably from hypoalbuminemia. 7. Atherosclerosis. 8.  Extensive   Electronically Signed   By: Herbie Baltimore M.D.   On: 01/24/2014 09:18   US Thoracentesis Asp Pleural Space W/img Guide  01/24/2014   INDICATION: Symptomatic left sided pleural effusion, recent fall with rib fractures and recent liver mass found on imaging.  EXAM: US THORACENTESIS ASP PLEURAL SPACE W/IMG GUIDE  COMPARISON:  None.  MEDICATIONS: None  COMPLICATIONS: None immediate   TECHNIQUE: Informed written consent was obtained from the patient after a discussion of the risks, benefits and alternatives to treatment. A timeout was performed prior to the initiation of the procedure.  Initial ultrasound scanning demonstrates a left pleural effusion. The lower chest was prepped and draped in the usual sterile fashion. 1% lidocaine was used for local anesthesia.  Under direct ultrasound guidance, a 19 gauge, 7-cm, Yueh catheter was introduced. An ultrasound image was saved for documentation purposes. The thoracentesis was performed. The catheter was removed and a dressing was applied. The patient tolerated the procedure well without immediate post procedural complication. The patient was escorted to have an upright chest radiograph.  FINDINGS: A total of approximately 1 liters of amber/blood tinged fluid was removed. Requested samples were sent to the laboratory.  IMPRESSION: Successful ultrasound-guided left sided thoracentesis yielding 1 liters of pleural fluid.  Read By:  Pattricia Boss PA-C   Electronically Signed   By: Gilmer Mor D.O.   On: 01/24/2014 15:51      Patient Active Problem List   Diagnosis Date Noted  . Rib fractures 01/24/2014  . Liver mass 01/24/2014  . Pleural effusion on left 01/24/2014  . Fall from slip, trip, or stumble   . Abdominal pain 08/31/2013  . Ascites 08/31/2013  . Generalized weakness 08/31/2013  . Cirrhosis of liver 03/28/2012  . Upper GI bleed 03/27/2012  . Thrombocytopenia 03/27/2012  . Esophageal varices with bleeding(456.0) 03/27/2012  . DM type 2 (diabetes mellitus, type 2) 10/14/2008  . MYOCARDIAL INFARCTION 10/14/2008  . CORONARY HEART DISEASE 10/14/2008  . ALLERGIC RHINITIS 10/14/2008  . BRONCHITIS 10/14/2008  . ASTHMA 10/14/2008      ASSESSMENT / PLAN:  PULMONARY  A: Appears to have baseline undiagnosed basal ILD NOS - possible UIP on CT Currently has large left pleural effusion    -iniital concern was hemothorax but  this has turned out to be TRANsUDATE. This likely HEPATIC HYDROTHORAX  P:   Diurese with combo of aldactone and lasix - going as high as 400mg  aldactone and lasix 160mg  maintiaing 100mg :40mg  ratio respectively and titration every 3 to 5 days   - I have increased his aldactone to 100mg  daily today and lasix to 40mg  daily 01/25/2014  < 2gm salt restricted diet  If these fail, consider TIPS but contraindicated if childpugh > 15 and indicated for </=10 ( his is at 10)   I will see him as opd     Future Appointments Date Time Provider Department Center  02/12/2014 11:00 AM Tammy Rogers Seeds, NP [facility] None       Dr. Kalman Shan, M.D., [facility].C.P Pulmonary and Critical Care Medicine Staff Physician [facility] Pager: [phone], If no answer or between  15:00h - 7:00h: call [phone]  01/25/2014 9:49 AM

## 2014-01-25 NOTE — Telephone Encounter (Signed)
[facility] notified.

## 2014-01-27 ENCOUNTER — Inpatient Hospital Stay (HOSPITAL_COMMUNITY)
Admission: EM | Admit: 2014-01-27 | Discharge: 2014-02-03 | DRG: 871 | Disposition: E | Payer: Medicare Other | Attending: Internal Medicine | Admitting: Internal Medicine

## 2014-01-27 ENCOUNTER — Emergency Department (HOSPITAL_COMMUNITY): Payer: Medicare Other

## 2014-01-27 ENCOUNTER — Other Ambulatory Visit: Payer: Self-pay

## 2014-01-27 ENCOUNTER — Encounter (HOSPITAL_COMMUNITY): Payer: Self-pay | Admitting: Emergency Medicine

## 2014-01-27 DIAGNOSIS — K7469 Other cirrhosis of liver: Secondary | ICD-10-CM | POA: Diagnosis present

## 2014-01-27 DIAGNOSIS — J45909 Unspecified asthma, uncomplicated: Secondary | ICD-10-CM | POA: Diagnosis present

## 2014-01-27 DIAGNOSIS — Z515 Encounter for palliative care: Secondary | ICD-10-CM

## 2014-01-27 DIAGNOSIS — N12 Tubulo-interstitial nephritis, not specified as acute or chronic: Secondary | ICD-10-CM | POA: Diagnosis present

## 2014-01-27 DIAGNOSIS — Z79899 Other long term (current) drug therapy: Secondary | ICD-10-CM

## 2014-01-27 DIAGNOSIS — E872 Acidosis: Secondary | ICD-10-CM | POA: Diagnosis present

## 2014-01-27 DIAGNOSIS — R17 Unspecified jaundice: Secondary | ICD-10-CM | POA: Diagnosis present

## 2014-01-27 DIAGNOSIS — Z66 Do not resuscitate: Secondary | ICD-10-CM | POA: Diagnosis present

## 2014-01-27 DIAGNOSIS — E11649 Type 2 diabetes mellitus with hypoglycemia without coma: Secondary | ICD-10-CM | POA: Diagnosis present

## 2014-01-27 DIAGNOSIS — Z951 Presence of aortocoronary bypass graft: Secondary | ICD-10-CM

## 2014-01-27 DIAGNOSIS — K7460 Unspecified cirrhosis of liver: Secondary | ICD-10-CM | POA: Diagnosis present

## 2014-01-27 DIAGNOSIS — W19XXXD Unspecified fall, subsequent encounter: Secondary | ICD-10-CM | POA: Diagnosis present

## 2014-01-27 DIAGNOSIS — I252 Old myocardial infarction: Secondary | ICD-10-CM

## 2014-01-27 DIAGNOSIS — I129 Hypertensive chronic kidney disease with stage 1 through stage 4 chronic kidney disease, or unspecified chronic kidney disease: Secondary | ICD-10-CM | POA: Diagnosis present

## 2014-01-27 DIAGNOSIS — N189 Chronic kidney disease, unspecified: Secondary | ICD-10-CM | POA: Diagnosis present

## 2014-01-27 DIAGNOSIS — A419 Sepsis, unspecified organism: Principal | ICD-10-CM | POA: Diagnosis present

## 2014-01-27 DIAGNOSIS — S2242XD Multiple fractures of ribs, left side, subsequent encounter for fracture with routine healing: Secondary | ICD-10-CM

## 2014-01-27 DIAGNOSIS — Z8673 Personal history of transient ischemic attack (TIA), and cerebral infarction without residual deficits: Secondary | ICD-10-CM | POA: Diagnosis not present

## 2014-01-27 DIAGNOSIS — G9340 Encephalopathy, unspecified: Secondary | ICD-10-CM | POA: Diagnosis present

## 2014-01-27 DIAGNOSIS — I959 Hypotension, unspecified: Secondary | ICD-10-CM | POA: Diagnosis present

## 2014-01-27 DIAGNOSIS — R160 Hepatomegaly, not elsewhere classified: Secondary | ICD-10-CM | POA: Diagnosis present

## 2014-01-27 DIAGNOSIS — R059 Cough, unspecified: Secondary | ICD-10-CM

## 2014-01-27 DIAGNOSIS — Z87891 Personal history of nicotine dependence: Secondary | ICD-10-CM

## 2014-01-27 DIAGNOSIS — R6521 Severe sepsis with septic shock: Secondary | ICD-10-CM | POA: Diagnosis present

## 2014-01-27 DIAGNOSIS — E871 Hypo-osmolality and hyponatremia: Secondary | ICD-10-CM | POA: Diagnosis present

## 2014-01-27 DIAGNOSIS — J90 Pleural effusion, not elsewhere classified: Secondary | ICD-10-CM | POA: Diagnosis present

## 2014-01-27 DIAGNOSIS — R55 Syncope and collapse: Secondary | ICD-10-CM | POA: Diagnosis present

## 2014-01-27 DIAGNOSIS — E119 Type 2 diabetes mellitus without complications: Secondary | ICD-10-CM

## 2014-01-27 DIAGNOSIS — R627 Adult failure to thrive: Secondary | ICD-10-CM | POA: Diagnosis present

## 2014-01-27 DIAGNOSIS — R64 Cachexia: Secondary | ICD-10-CM | POA: Diagnosis present

## 2014-01-27 DIAGNOSIS — K922 Gastrointestinal hemorrhage, unspecified: Secondary | ICD-10-CM | POA: Diagnosis present

## 2014-01-27 DIAGNOSIS — N390 Urinary tract infection, site not specified: Secondary | ICD-10-CM | POA: Diagnosis present

## 2014-01-27 DIAGNOSIS — S2249XA Multiple fractures of ribs, unspecified side, initial encounter for closed fracture: Secondary | ICD-10-CM | POA: Diagnosis present

## 2014-01-27 DIAGNOSIS — R05 Cough: Secondary | ICD-10-CM

## 2014-01-27 DIAGNOSIS — R531 Weakness: Secondary | ICD-10-CM

## 2014-01-27 DIAGNOSIS — R000 Tachycardia, unspecified: Secondary | ICD-10-CM | POA: Diagnosis present

## 2014-01-27 DIAGNOSIS — S2239XA Fracture of one rib, unspecified side, initial encounter for closed fracture: Secondary | ICD-10-CM | POA: Diagnosis present

## 2014-01-27 LAB — URINALYSIS, ROUTINE W REFLEX MICROSCOPIC
Glucose, UA: NEGATIVE mg/dL
HGB URINE DIPSTICK: NEGATIVE
KETONES UR: 15 mg/dL — AB
NITRITE: POSITIVE — AB
Protein, ur: 30 mg/dL — AB
Specific Gravity, Urine: 1.022 (ref 1.005–1.030)
Urobilinogen, UA: 4 mg/dL — ABNORMAL HIGH (ref 0.0–1.0)
pH: 5 (ref 5.0–8.0)

## 2014-01-27 LAB — COMPREHENSIVE METABOLIC PANEL
ALBUMIN: 2 g/dL — AB (ref 3.5–5.2)
ALT: 12 U/L (ref 0–53)
AST: 35 U/L (ref 0–37)
Alkaline Phosphatase: 158 U/L — ABNORMAL HIGH (ref 39–117)
Anion gap: 16 — ABNORMAL HIGH (ref 5–15)
BUN: 26 mg/dL — ABNORMAL HIGH (ref 6–23)
CALCIUM: 8.7 mg/dL (ref 8.4–10.5)
CO2: 23 mEq/L (ref 19–32)
CREATININE: 1.38 mg/dL — AB (ref 0.50–1.35)
Chloride: 95 mEq/L — ABNORMAL LOW (ref 96–112)
GFR calc Af Amer: 54 mL/min — ABNORMAL LOW (ref >90)
GFR, EST NON AFRICAN AMERICAN: 47 mL/min — AB (ref >90)
Glucose, Bld: 75 mg/dL (ref 70–99)
Potassium: 4.5 mEq/L (ref 3.7–5.3)
Sodium: 134 mEq/L — ABNORMAL LOW (ref 137–147)
Total Bilirubin: 3.6 mg/dL — ABNORMAL HIGH (ref 0.3–1.2)
Total Protein: 7.3 g/dL (ref 6.0–8.3)

## 2014-01-27 LAB — PROTIME-INR
INR: 1.48 (ref 0.00–1.49)
PROTHROMBIN TIME: 18.1 s — AB (ref 11.6–15.2)

## 2014-01-27 LAB — CBG MONITORING, ED
GLUCOSE-CAPILLARY: 62 mg/dL — AB (ref 70–99)
Glucose-Capillary: 104 mg/dL — ABNORMAL HIGH (ref 70–99)
Glucose-Capillary: 64 mg/dL — ABNORMAL LOW (ref 70–99)
Glucose-Capillary: 109 mg/dL — ABNORMAL HIGH (ref 70–99)

## 2014-01-27 LAB — I-STAT CHEM 8, ED
BUN: 26 mg/dL — ABNORMAL HIGH (ref 6–23)
Calcium, Ion: 1.04 mmol/L — ABNORMAL LOW (ref 1.13–1.30)
Chloride: 99 mEq/L (ref 96–112)
Creatinine, Ser: 1.4 mg/dL — ABNORMAL HIGH (ref 0.50–1.35)
Glucose, Bld: 75 mg/dL (ref 70–99)
HEMATOCRIT: 50 % (ref 39.0–52.0)
HEMOGLOBIN: 17 g/dL (ref 13.0–17.0)
Potassium: 4.3 mEq/L (ref 3.7–5.3)
SODIUM: 136 meq/L — AB (ref 137–147)
TCO2: 26 mmol/L (ref 0–100)

## 2014-01-27 LAB — TROPONIN I

## 2014-01-27 LAB — CBC WITH DIFFERENTIAL/PLATELET
BASOS PCT: 0 % (ref 0–1)
Basophils Absolute: 0 10*3/uL (ref 0.0–0.1)
Eosinophils Absolute: 0 10*3/uL (ref 0.0–0.7)
Eosinophils Relative: 0 % (ref 0–5)
HCT: 45.2 % (ref 39.0–52.0)
Hemoglobin: 15.6 g/dL (ref 13.0–17.0)
Lymphocytes Relative: 17 % (ref 12–46)
Lymphs Abs: 1.8 10*3/uL (ref 0.7–4.0)
MCH: 34.4 pg — ABNORMAL HIGH (ref 26.0–34.0)
MCHC: 34.5 g/dL (ref 30.0–36.0)
MCV: 99.6 fL (ref 78.0–100.0)
MONOS PCT: 3 % (ref 3–12)
Monocytes Absolute: 0.3 10*3/uL (ref 0.1–1.0)
NEUTROS ABS: 8.6 10*3/uL — AB (ref 1.7–7.7)
NEUTROS PCT: 80 % — AB (ref 43–77)
Platelets: 152 10*3/uL (ref 150–400)
RBC: 4.54 MIL/uL (ref 4.22–5.81)
RDW: 16.1 % — ABNORMAL HIGH (ref 11.5–15.5)
WBC: 10.8 10*3/uL — ABNORMAL HIGH (ref 4.0–10.5)

## 2014-01-27 LAB — GLUCOSE, CAPILLARY: Glucose-Capillary: 88 mg/dL (ref 70–99)

## 2014-01-27 LAB — BODY FLUID CULTURE
Culture: NO GROWTH
GRAM STAIN: NONE SEEN
Special Requests: NORMAL

## 2014-01-27 LAB — URINE MICROSCOPIC-ADD ON

## 2014-01-27 LAB — ABO/RH: ABO/RH(D): B POS

## 2014-01-27 LAB — TYPE AND SCREEN
ABO/RH(D): B POS
Antibody Screen: NEGATIVE

## 2014-01-27 LAB — LACTIC ACID, PLASMA
Lactic Acid, Venous: 6.1 mmol/L — ABNORMAL HIGH (ref 0.5–2.2)
Lactic Acid, Venous: 5.7 mmol/L — ABNORMAL HIGH (ref 0.5–2.2)

## 2014-01-27 LAB — POC OCCULT BLOOD, ED: FECAL OCCULT BLD: POSITIVE — AB

## 2014-01-27 LAB — MRSA PCR SCREENING: MRSA BY PCR: NEGATIVE

## 2014-01-27 MED ORDER — SODIUM CHLORIDE 0.9 % IV BOLUS (SEPSIS)
1000.0000 mL | Freq: Once | INTRAVENOUS | Status: AC
  Administered 2014-01-27: 1000 mL via INTRAVENOUS

## 2014-01-27 MED ORDER — SODIUM CHLORIDE 0.9 % IV BOLUS (SEPSIS)
500.0000 mL | Freq: Once | INTRAVENOUS | Status: AC
  Administered 2014-01-27 (×2): 500 mL via INTRAVENOUS

## 2014-01-27 MED ORDER — SODIUM CHLORIDE 0.9 % IV SOLN: Freq: Once | INTRAVENOUS | Status: DC

## 2014-01-27 MED ORDER — GLUCOSE 40 % PO GEL
1.0000 | Freq: Once | ORAL | Status: AC
  Administered 2014-01-27: 37.5 g via ORAL
  Filled 2014-01-27: qty 1

## 2014-01-27 MED ORDER — HEPARIN SODIUM (PORCINE) 5000 UNIT/ML IJ SOLN: 5000.0000 [IU] | Freq: Three times a day (TID) | INTRAMUSCULAR | Status: DC

## 2014-01-27 MED ORDER — LORAZEPAM 2 MG/ML IJ SOLN: 0.5000 mg | INTRAMUSCULAR | Status: DC | PRN

## 2014-01-27 MED ORDER — SODIUM CHLORIDE 0.9 % IV BOLUS (SEPSIS): 500.0000 mL | Freq: Once | INTRAVENOUS | Status: DC

## 2014-01-27 MED ORDER — DEXTROSE 50 % IV SOLN: 25.0000 mL | INTRAVENOUS | Status: DC | PRN

## 2014-01-27 MED ORDER — PANTOPRAZOLE SODIUM 40 MG PO TBEC: 40.0000 mg | DELAYED_RELEASE_TABLET | Freq: Two times a day (BID) | ORAL | Status: DC

## 2014-01-27 MED ORDER — TRAZODONE HCL 50 MG PO TABS
50.0000 mg | ORAL_TABLET | Freq: Every evening | ORAL | Status: DC | PRN
  Filled 2014-01-27: qty 1

## 2014-01-27 MED ORDER — CETYLPYRIDINIUM CHLORIDE 0.05 % MT LIQD
7.0000 mL | Freq: Two times a day (BID) | OROMUCOSAL | Status: DC
  Administered 2014-01-27: 7 mL via OROMUCOSAL

## 2014-01-27 MED ORDER — SULFAMETHOXAZOLE-TMP DS 800-160 MG PO TABS
1.0000 | ORAL_TABLET | Freq: Every day | ORAL | Status: DC
  Filled 2014-01-27: qty 1

## 2014-01-27 MED ORDER — SODIUM CHLORIDE 0.9 % IV BOLUS (SEPSIS)
1000.0000 mL | Freq: Once | INTRAVENOUS | Status: AC
  Administered 2014-01-28: 1000 mL via INTRAVENOUS

## 2014-01-27 MED ORDER — SODIUM CHLORIDE 0.9 % IV BOLUS (SEPSIS)
500.0000 mL | Freq: Once | INTRAVENOUS | Status: AC
  Administered 2014-01-27: 17:00:00 via INTRAVENOUS

## 2014-01-27 MED ORDER — SODIUM CHLORIDE 0.9 % IV BOLUS (SEPSIS)
500.0000 mL | Freq: Once | INTRAVENOUS | Status: AC
  Administered 2014-01-27: 500 mL via INTRAVENOUS

## 2014-01-27 MED ORDER — SODIUM CHLORIDE 0.9 % IV SOLN
INTRAVENOUS | Status: DC
  Administered 2014-01-27 – 2014-01-28 (×2): via INTRAVENOUS

## 2014-01-27 MED ORDER — SODIUM CHLORIDE 0.9 % IJ SOLN
3.0000 mL | Freq: Two times a day (BID) | INTRAMUSCULAR | Status: DC
  Administered 2014-01-27 – 2014-01-28 (×2): 3 mL via INTRAVENOUS

## 2014-01-27 MED ORDER — LACTULOSE 10 GM/15ML PO SOLN
20.0000 g | Freq: Every day | ORAL | Status: DC
  Filled 2014-01-27: qty 30

## 2014-01-27 MED ORDER — DEXTROSE 5 % IV SOLN
1.0000 g | INTRAVENOUS | Status: DC
Start: 2014-01-28 — End: 2014-01-28

## 2014-01-27 MED ORDER — DEXTROSE 5 % IV SOLN
1.0000 g | Freq: Once | INTRAVENOUS | Status: DC
  Filled 2014-01-27: qty 10

## 2014-01-27 MED ORDER — DEXTROSE 50 % IV SOLN
1.0000 | Freq: Once | INTRAVENOUS | Status: AC
  Administered 2014-01-27: 50 mL via INTRAVENOUS
  Filled 2014-01-27: qty 50

## 2014-01-27 MED ORDER — SODIUM CHLORIDE 0.9 % IV SOLN: INTRAVENOUS | Status: DC

## 2014-01-27 NOTE — ED Notes (Signed)
Per EMS: pt came from home.  Call came in when Son was helping his father in the bathroom and the patient became weak and had to be stabilized on the toilet.  While EMS was with the pt pt become lethargic and would not respond to commands.  Patient's SBP has been fluctuating in the 90's -100's during transport.    Pt reports having felt week for "a while".  Was recently treated in the hospital for "infection in his abdomen", related to liver cirrhosis.

## 2014-01-27 NOTE — ED Notes (Addendum)
Son and pt state that if pt were to stop breathing or heart to stop beating that pt "wouldnt want them to do anything." Primary RN and son at bedside when statement made by pt. PA made aware.

## 2014-01-27 NOTE — H&P (Addendum)
[facility] History and Physical  STILLMAN BUENGER ZOX:[id number] DOB: 1931/09/16 DOA: Feb 17, 2014  Referring physician: ED physician PCP: Dorrene German, MD  Specialists:   Chief Complaint: AMS and weakness  HPI: Alex Wise is a 80 y.o. male with PMH of hypertension, cirrhosis of liver, MI, CKD, stroke, and diabetes, who present with AMS and generalized weakness.   Patient had multiple left rib fractures with large left pleural effusion on 01/24/14. He was adimtted and treated in [facility] hospital. He refused to be evaluated by physical and occupational therapy in the hospital. He also declined SNF for rehabilitation. After he went home on 01/25/14, his condition has been deteriorating. Per his son, patient has very poor appetite with significantly decreased oral intake. He is very weak, which has been progressively getting worse. Today he could not stand up and walk. He has fevers subjectively and cough with sputum. He dose not have dysuria, diarrhea, rectal bleeding, hematuria. He is brought to the hospital for further evaluation and treatment. He was awake, alert and oriented inially, but becames confused later in ED.  He was found to have possible UTI by urinalysis. His is severely septic initially, then become septic shock. Aggressive IV fluid was started, without significant improvement. He refused vasopressors. He is DO NOT RESUSCITATE. Patient is admitted for palliative care.   Review of Systems: As presented in the history of presenting illness, rest negative.  Where does patient live? Lives alone at home   Can patient participate in ADLs? barely  Allergy:  Allergies  Allergen Reactions  . Codeine Nausea And Vomiting    dizziness    Past Medical History  Diagnosis Date  . Hypertension   . Cirrhosis of liver 03/28/2012  . Esophageal varices with bleeding(456.0) 03/27/2012  . Myocardial infarction     x 3  . Portal hypertensive gastropathy   . Chronic kidney disease      mild, no nephrologist  . 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 Diabetes mellitus     Past Surgical History  Procedure Laterality Date  . Esophagogastroduodenoscopy  03/27/2012    Procedure: ESOPHAGOGASTRODUODENOSCOPY (EGD);  Surgeon: Louis Meckel, MD;  Location: [facility];  Service: Endoscopy;  Laterality: N/A;  bedside  . Esophagogastroduodenoscopy  04/19/2012    Procedure: ESOPHAGOGASTRODUODENOSCOPY (EGD);  Surgeon: Louis Meckel, MD;  Location: [facility];  Service: Endoscopy;  Laterality: N/A;  . Esophageal banding  04/19/2012    Procedure: ESOPHAGEAL BANDING;  Surgeon: Louis Meckel, MD;  Location: [facility];  Service: Endoscopy;  Laterality: N/A;  . Esophagogastroduodenoscopy N/A 05/22/2012    Procedure: ESOPHAGOGASTRODUODENOSCOPY (EGD);  Surgeon: Louis Meckel, MD;  Location: [facility];  Service: Endoscopy;  Laterality: N/A;  . Esophageal banding N/A 05/22/2012    Procedure: ESOPHAGEAL BANDING;  Surgeon: Louis Meckel, MD;  Location: [facility];  Service: Endoscopy;  Laterality: N/A;  . For cirrhosis  july 2015    fluid removed for cirrhosis  . Coronary artery bypass graft  1992    x 7    Social History:  reports that he quit smoking about 31 years ago. His smoking use included Cigarettes. He has a 2 pack-year smoking history. He has never used smokeless tobacco. He reports that he does not drink alcohol or use illicit drugs.  Family History:  Family History  Problem Relation Age of Onset  . Lymphoma Brother   . Lung cancer Father   . Ovarian cancer Sister   .  Heart attack Mother      Prior to Admission medications   Medication Sig Start Date End Date Taking? Authorizing Provider  furosemide (LASIX) 20 MG tablet Take 20 mg by mouth daily as needed for fluid.   Yes Historical Provider, MD  lactulose (CHRONULAC) 10 GM/15ML solution Take 30 mLs (20 g total) by mouth daily. 09/06/13  Yes [name], MD  nadolol (CORGARD) 40 MG tablet Take  40 mg by mouth every morning.   Yes Historical Provider, MD  pantoprazole (PROTONIX) 40 MG tablet Take 40 mg by mouth 2 (two) times daily.   Yes Historical Provider, MD  spironolactone (ALDACTONE) 50 MG tablet Take 50 mg by mouth daily.   Yes Historical Provider, MD  traZODone (DESYREL) 50 MG tablet Take 50 mg by mouth at bedtime as needed for sleep.    Yes Historical Provider, MD  sulfamethoxazole-trimethoprim (BACTRIM DS) 800-160 MG per tablet Take 1 tablet by mouth daily. 01/25/14   Renae Fickle, MD    Physical Exam: Filed Vitals:   01/04/2014 0300 01/20/2014 0400 01/23/2014 0500 01/06/2014 0600  BP: 42/28 49/31 48/32  49/20  Pulse:  61 91 65  Temp:  96.8 F (36 C)    TempSrc:  Oral    Resp: 19 18 23 20   Height:      Weight:      SpO2:  98% 100% 100%   General:  Lethargic, distressed HEENT:       Eyes: PERRL, EOMI, jaundice       ENT: No discharge from the ears and nose, no pharynx injection, no tonsillar enlargement.        Neck: No JVD, no bruit, no mass felt. Cardiac: S1/S2, RRR, No murmurs, gallops or rubs Pulm: scattered crackles at bases posteriorly.  Abd: Soft, nondistended, nontender, no rebound pain, no organomegaly, BS present Ext: No edema. 2+DP/PT pulse bilaterally Musculoskeletal: No joint deformities, erythema, or stiffness, ROM full Skin: No rashes.  Neuro: very drowsy, oriented to person only, cranial nerves II-XII grossly intact, moves all extremities.  Psych: Patient is not psychotic, no suicidal or hemocidal ideation.   Labs on Admission:  Basic Metabolic Panel:  Recent Labs Lab 01/24/14 0206 01/25/14 0352 01/26/2014 1659 01/08/2014 1728  NA 132* 133* 134* 136*  K 4.2 3.9 4.5 4.3  CL 95* 95* 95* 99  CO2 25 26 23   --   GLUCOSE 162* 105* 75 75  BUN 14 18 26* 26*  CREATININE 1.01 1.01 1.38* 1.40*  CALCIUM 8.2* 8.3* 8.7  --   MG  --  1.7  --   --   PHOS  --  3.4  --   --    Liver Function Tests:  Recent Labs Lab 01/24/14 0206 01/25/14 0352  01/04/2014 1659  AST 33 26 35  ALT 12 10 12   ALKPHOS 160* 131* 158*  BILITOT 2.9* 3.0* 3.6*  PROT 6.8 6.1 7.3  ALBUMIN 2.0* 1.8* 2.0*   No results found for this basename: LIPASE, AMYLASE,  in the last 168 hours  Recent Labs Lab 01/24/14 0206  AMMONIA 30   CBC:  Recent Labs Lab 01/24/14 0206 01/25/14 0352 01/25/14 1030 01/05/2014 1659 01/26/2014 1728  WBC 12.7* 8.2  --  10.8*  --   NEUTROABS 9.8*  --   --  8.6*  --   HGB 12.3* 10.9* 11.9* 15.6 17.0  HCT 35.6* 31.9* 34.3* 45.2 50.0  MCV 100.3* 99.1  --  99.6  --   PLT 110* 88*  --  152  --    Cardiac Enzymes:  Recent Labs Lab 01/24/14 0206 2013-06-04 1659  CKTOTAL 27  --   TROPONINI  --  <0.30    BNP (last 3 results) No results found for this basename: PROBNP,  in the last 8760 hours CBG:  Recent Labs Lab 2013-06-04 1621 2013-06-04 1733 2013-06-04 1823 2013-06-04 2059 2013-06-04 2350  GLUCAP 62* 64* 104* 109* 88    Radiological Exams on Admission: Dg Chest Portable 1 View  06/01/13   CLINICAL DATA:  Hypotension and loss of consciousness  EXAM: PORTABLE CHEST - 1 VIEW  COMPARISON:  01/24/2014 chest radiograph.  Chest CT the 01/24/2014  FINDINGS: Multiple skin folds project over the left laterally. There are changes of median sternotomy for CABG. Heart and mediastinal contours are stable. There are low lung volumes bilaterally. There is diffuse interstitial prominence bilaterally. Improved aeration of the left lung base compared to chest radiograph dated 01/24/2014.  Small bilateral pleural effusions, left greater than right. Diffuse osteopenia. Several left-sided rib fractures are seen. The patient's rib fractures are best demonstrated on recent chest CT.  IMPRESSION: Improved aeration in the left lung base compared to recent prior chest radiograph. Small bilateral pleural effusions.  Prominent interstitial markings bilaterally may be due to chronic interstitial lung disease when correlated with recent chest CT.    Electronically Signed   By: Britta MccreedySusan  Turner M.D.   On: 06/01/13 17:28   Assessment/Plan Principal Problem:   Septic shock Active Problems:   DM type 2 (diabetes mellitus, type 2)   Asthma   Upper GI bleed   Cirrhosis of liver   Rib fractures   UTI (lower urinary tract infection)  Septic shock: It is likely due to UTI. Before patient became confused, he strongly and clearly refused vasopressors. He wants to be DO NOT RESUSCITATE. He was treated with very aggressive IV fluid resuscitation without success. IV antibiotic was started. His blood pressure continued to decrease slowly. Though patient himself strongly refused vasopressors, family was still reluctant to pursue comfort measures initially. Therefore, PCCM was consulted. Dr. Eugenia PancoastRimble evaluated the patient, and believed that placing central line and utilizing pressors would be futile. Dr. Eugenia Pancoastrimble spoke with pt's son Charma IgoBarry Betters, explained all of the above and son agrees to full comfort measures. All unnecessary orders have been dc'd  - patient was admitted to [facility] initially - started comfort care. - on Ativan and morphine  DVT ppx: SQ Heparin  Code Status: Full code Family Communication:  Yes, patient's son at bed side Disposition Plan: Admit to inpatient   Date of Service 01/08/2014    Lorretta HarpIU, Stefanee Mckell [facility] Pager [phone]  If 7PM-7AM, please contact night-coverage [email or web address] Password [email or web address] 01/25/2014, 6:06 AM

## 2014-01-27 NOTE — Plan of Care (Addendum)
1051 pm RN paged- pt remains hypotensive despite 4 liters IVF- is DNR- notes reviewed since admission and pt confirms no ETT or CPR- RN re questioned pt and he informed her no pressors- pt is alert despite SBP 40's. Pt c/o thirst so will allow comfort diet- seems pt is actively dying. RN confirms family aware.  1215 AM RN called back- now pt wants pressors if indicate for low BP-labs reviewed and pt quite hemoconcentrated noting Hgb was 12 at dc on 10/23 and now up to 17- was on Chronulac, Lasix and Aldactone at home. Lactate elevated at 6.0. Admitting MD gave orders for additional NS 1 liter. I have ordered 12.5 gms of 25% albumin- pt with only 2 small bore IVs (both #22 gauge) so have placed call to E link to see if PCCM can place central line for hydration and possible ST pressors- prognosis appears grim and pt again does not want CPR or ETT but wants to see if can improve with fluids or pressors if needed. Pt himself is lethargic at times and appears very debilitated/gray. Pt and family would benefit from Palliative eval to further clarify GOC since suspect recurrent dehydration will continue after each discharge (documented FTT since Sept which is new). Evaluated pt at bedside myself and confirmed current treatment plan with pt and family including son.  1245 AM Further explained to family (unfortunately son had just left room and not expected back for >1 hour) that prognosis is poor and suspect pt will continue to have recurrent episodes with dehydration and re admits to the hospital. Family still reluctant to pursue comfort measures. PCCM resident Dr. Eugenia Pancoast at bedside; pt examined and discussed. Agreed that placing central line and utilizing pressors would be futile.   1255 am Dr. Eugenia Pancoast spoke with pt son Dareon Sorden explaining all of the above and son agrees to full comfort measures. Plan to give tiny doses of MSO4 since BP tenuous until son arrives then can increase comfort meds. All unnecessary  orders have been dc'd    Junious Silk, ANP

## 2014-01-27 NOTE — ED Provider Notes (Signed)
CSN: [id number]     Arrival date & time 12-Feb-2014  1545 History   First MD Initiated Contact with Patient 02/12/2014 1550     Chief Complaint  Patient presents with  . Loss of Consciousness     (Consider location/radiation/quality/duration/timing/severity/associated sxs/prior Treatment) HPI  CODE STATUS:  7:50pm on 02-12-14  in front of his son that is a witness, has decided that he does not want chest compressions or to be intubated. DNR  Patient  With PMH of hypertension, cirrhosis of liver, MI, CKD, stroke, and diabetes to the ER by EMS with planes with hypotension, cough, weakness.  The patient was diagnosed with broken ribs the 21st of this month after falling. He says that since then he has not been feeling well and is now coughing. Reports feeling weak, fevers subjectively, cough with sputum and is unsure of what color it is. Arrival the patient's glucose is 62, temperature is 97.4 orally, blood pressure 71/39. He is awake alert and oriented at this time. He denies headache, CP, SOB, dysuria, diarrhea, rectal bleeding, hematuria.  He admits to decreased appetite and  eating and drinking less than normal.   Past Medical History  Diagnosis Date  . Hypertension   . Cirrhosis of liver 03/28/2012  . Esophageal varices with bleeding(456.0) 03/27/2012  . Myocardial infarction     x 3  . Portal hypertensive gastropathy   . Chronic kidney disease     mild, no nephrologist  . 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 Diabetes mellitus    Past Surgical History  Procedure Laterality Date  . Esophagogastroduodenoscopy  03/27/2012    Procedure: ESOPHAGOGASTRODUODENOSCOPY (EGD);  Surgeon: Louis Meckel, MD;  Location: [facility];  Service: Endoscopy;  Laterality: N/A;  bedside  . Esophagogastroduodenoscopy  04/19/2012    Procedure: ESOPHAGOGASTRODUODENOSCOPY (EGD);  Surgeon: Louis Meckel, MD;  Location: [facility];  Service: Endoscopy;  Laterality: N/A;  . Esophageal  banding  04/19/2012    Procedure: ESOPHAGEAL BANDING;  Surgeon: Louis Meckel, MD;  Location: [facility];  Service: Endoscopy;  Laterality: N/A;  . Esophagogastroduodenoscopy N/A 05/22/2012    Procedure: ESOPHAGOGASTRODUODENOSCOPY (EGD);  Surgeon: Louis Meckel, MD;  Location: [facility];  Service: Endoscopy;  Laterality: N/A;  . Esophageal banding N/A 05/22/2012    Procedure: ESOPHAGEAL BANDING;  Surgeon: Louis Meckel, MD;  Location: [facility];  Service: Endoscopy;  Laterality: N/A;  . For cirrhosis  july 2015    fluid removed for cirrhosis  . Coronary artery bypass graft  1992    x 7   Family History  Problem Relation Age of Onset  . Lymphoma Brother   . Lung cancer Father   . Ovarian cancer Sister   . Heart attack Mother    History  Substance Use Topics  . Smoking status: Former Smoker -- 1.00 packs/day for 2 years    Types: Cigarettes    Quit date: 04/05/1982  . Smokeless tobacco: Never Used  . Alcohol Use: No    Review of Systems  10 Systems reviewed and are negative for acute change except as noted in the HPI.   Allergies  Codeine  Home Medications   Prior to Admission medications   Medication Sig Start Date End Date Taking? Authorizing Provider  furosemide (LASIX) 20 MG tablet Take 20 mg by mouth daily as needed for fluid.   Yes Historical Provider, MD  lactulose (CHRONULAC) 10 GM/15ML solution Take 30 mLs (20 g total) by mouth  daily. 09/06/13  Yes Alison MurrayAlma M Devine, MD  nadolol (CORGARD) 40 MG tablet Take 40 mg by mouth every morning.   Yes Historical Provider, MD  pantoprazole (PROTONIX) 40 MG tablet Take 40 mg by mouth 2 (two) times daily.   Yes Historical Provider, MD  spironolactone (ALDACTONE) 50 MG tablet Take 50 mg by mouth daily.   Yes Historical Provider, MD  traZODone (DESYREL) 50 MG tablet Take 50 mg by mouth at bedtime as needed for sleep.    Yes Historical Provider, MD  sulfamethoxazole-trimethoprim (BACTRIM DS) 800-160 MG per tablet Take 1 tablet  by mouth daily. 01/25/14   Renae FickleMackenzie Short, MD   BP 68/33  Pulse 68  Temp(Src) 98.7 F (37.1 C) (Rectal)  Resp 19  Ht 5\' 5"  (1.651 m)  Wt 130 lb (58.968 kg)  BMI 21.63 kg/m2  SpO2 97% Physical Exam  Nursing note and vitals reviewed. Constitutional: He appears well-developed. He appears lethargic. He has a sickly appearance. He appears distressed (pale and jaundice).  HENT:  Head: Normocephalic and atraumatic. Head is without abrasion and without contusion.  Right Ear: Tympanic membrane and ear canal normal.  Left Ear: Tympanic membrane and ear canal normal.  Nose: Nose normal.  Mouth/Throat: Uvula is midline and oropharynx is clear and moist. Mucous membranes are dry.  Eyes: Pupils are equal, round, and reactive to light.  Neck: Normal range of motion. Neck supple. No spinous process tenderness and no muscular tenderness present.  Cardiovascular: Normal rate and regular rhythm.   Pulmonary/Chest: Effort normal. No respiratory distress. He has no wheezes. He has rales (at bilateral bases).  Abdominal: Soft. Bowel sounds are normal. There is no tenderness. There is no rigidity, no rebound, no guarding and no CVA tenderness.  Neurological: He appears lethargic.  Pt uncooperative with exam. Has intact sensation to toes and will wiggle toes for me.  Skin: Skin is warm and dry.    ED Course  Procedures (including critical care time) Labs Review Labs Reviewed  COMPREHENSIVE METABOLIC PANEL - Abnormal; Notable for the following:    Sodium 134 (*)    Chloride 95 (*)    BUN 26 (*)    Creatinine, Ser 1.38 (*)    Albumin 2.0 (*)    Alkaline Phosphatase 158 (*)    Total Bilirubin 3.6 (*)    GFR calc non Af Amer 47 (*)    GFR calc Af Amer 54 (*)    Anion gap 16 (*)    All other components within normal limits  PROTIME-INR - Abnormal; Notable for the following:    Prothrombin Time 18.1 (*)    All other components within normal limits  CBC WITH DIFFERENTIAL - Abnormal; Notable for  the following:    WBC 10.8 (*)    MCH 34.4 (*)    RDW 16.1 (*)    Neutrophils Relative % 80 (*)    Neutro Abs 8.6 (*)    All other components within normal limits  LACTIC ACID, PLASMA - Abnormal; Notable for the following:    Lactic Acid, Venous 6.1 (*)    All other components within normal limits  URINALYSIS, ROUTINE W REFLEX MICROSCOPIC - Abnormal; Notable for the following:    Color, Urine AMBER (*)    APPearance CLOUDY (*)    Bilirubin Urine MODERATE (*)    Ketones, ur 15 (*)    Protein, ur 30 (*)    Urobilinogen, UA 4.0 (*)    Nitrite POSITIVE (*)    Leukocytes, UA  SMALL (*)    All other components within normal limits  URINE MICROSCOPIC-ADD ON - Abnormal; Notable for the following:    Bacteria, UA FEW (*)    Casts HYALINE CASTS (*)    All other components within normal limits  CBG MONITORING, ED - Abnormal; Notable for the following:    Glucose-Capillary 62 (*)    All other components within normal limits  I-STAT CHEM 8, ED - Abnormal; Notable for the following:    Sodium 136 (*)    BUN 26 (*)    Creatinine, Ser 1.40 (*)    Calcium, Ion 1.04 (*)    All other components within normal limits  CBG MONITORING, ED - Abnormal; Notable for the following:    Glucose-Capillary 64 (*)    All other components within normal limits  POC OCCULT BLOOD, ED - Abnormal; Notable for the following:    Fecal Occult Bld POSITIVE (*)    All other components within normal limits  CBG MONITORING, ED - Abnormal; Notable for the following:    Glucose-Capillary 104 (*)    All other components within normal limits  CBG MONITORING, ED - Abnormal; Notable for the following:    Glucose-Capillary 109 (*)    All other components within normal limits  CULTURE, BLOOD (ROUTINE X 2)  CULTURE, BLOOD (ROUTINE X 2)  URINE CULTURE  TROPONIN I  OCCULT BLOOD X 1 CARD TO LAB, STOOL  TYPE AND SCREEN  ABO/RH    Imaging Review Dg Chest Portable 1 View  02/04/14   CLINICAL DATA:  Hypotension and loss  of consciousness  EXAM: PORTABLE CHEST - 1 VIEW  COMPARISON:  01/24/2014 chest radiograph.  Chest CT the 01/24/2014  FINDINGS: Multiple skin folds project over the left laterally. There are changes of median sternotomy for CABG. Heart and mediastinal contours are stable. There are low lung volumes bilaterally. There is diffuse interstitial prominence bilaterally. Improved aeration of the left lung base compared to chest radiograph dated 01/24/2014.  Small bilateral pleural effusions, left greater than right. Diffuse osteopenia. Several left-sided rib fractures are seen. The patient's rib fractures are best demonstrated on recent chest CT.  IMPRESSION: Improved aeration in the left lung base compared to recent prior chest radiograph. Small bilateral pleural effusions.  Prominent interstitial markings bilaterally may be due to chronic interstitial lung disease when correlated with recent chest CT.   Electronically Signed   By: Britta Mccreedy M.D.   On: February 04, 2014 17:28     EKG Interpretation None      MDM   Final diagnoses:  Cough  Hypotension  Adult failure to thrive  UTI (lower urinary tract infection)  Septic shock    CRITICAL CARE Performed by: Dorthula Matas Total critical care time: 60 minutes Critical care time was exclusive of separately billable procedures and treating other patients. Critical care was necessary to treat or prevent imminent or life-threatening deterioration. Critical care was time spent personally by me on the following activities: development of treatment plan with patient and/or surrogate as well as nursing, discussions with consultants, evaluation of patient's response to treatment, examination of patient, obtaining history from patient or surrogate, ordering and performing treatments and interventions, ordering and review of laboratory studies, ordering and review of radiographic studies, pulse oximetry and re-evaluation of patient's condition.   Patient came to  the ED hypotensive and with low glucose levels. He says that he is not eating or drinking because he does not want to. He lives alone and is resistant  to rehab facility. He was discharged from the hospital only 3 days ago because he did not want to stay after falling, being down on the ground from an extended period of time, and breaking some of his ribs. The family had to convince patient to stay. Originally he says he would like to go home understanding that he would most likely die if he did. His son convinced him to stay. Despite wanting to go home and die the patient says he would like to be a FULL CODE still.   Patients blood pressure improved with fluids but then started to decline again.  CODE STATUS:  7:50pm on 01/04/2014  in front of his son that is a witness, has decided that he does not want chest compressions or to be intubated. DNR  [facility] agreed to admit. Dr. Youlanda Roys, X. Pt will go to stepdown unit.   Dorthula Matas, PA-C 01/19/2014 2043  Dorthula Matas, PA-C 01/16/2014 2145

## 2014-01-28 DIAGNOSIS — R05 Cough: Secondary | ICD-10-CM

## 2014-01-28 DIAGNOSIS — A419 Sepsis, unspecified organism: Secondary | ICD-10-CM | POA: Diagnosis present

## 2014-01-28 DIAGNOSIS — K7469 Other cirrhosis of liver: Secondary | ICD-10-CM

## 2014-01-28 DIAGNOSIS — R6521 Severe sepsis with septic shock: Secondary | ICD-10-CM

## 2014-01-28 DIAGNOSIS — J948 Other specified pleural conditions: Secondary | ICD-10-CM

## 2014-01-28 MED ORDER — MORPHINE SULFATE 2 MG/ML IJ SOLN
0.5000 mg | INTRAMUSCULAR | Status: DC | PRN
  Administered 2014-01-28 (×2): 0.5 mg via INTRAVENOUS
  Filled 2014-01-28 (×2): qty 1

## 2014-01-28 MED ORDER — MORPHINE SULFATE 2 MG/ML IJ SOLN
1.0000 mg | INTRAMUSCULAR | Status: DC | PRN
  Administered 2014-01-28: 2 mg via INTRAVENOUS
  Filled 2014-01-28: qty 1

## 2014-01-28 MED ORDER — LORAZEPAM 2 MG/ML IJ SOLN: 0.5000 mg | INTRAMUSCULAR | Status: DC | PRN

## 2014-01-28 MED ORDER — ALBUMIN HUMAN 25 % IV SOLN
12.5000 g | Freq: Once | INTRAVENOUS | Status: DC
  Filled 2014-01-28: qty 50

## 2014-01-29 ENCOUNTER — Ambulatory Visit (HOSPITAL_COMMUNITY): Admission: RE | Admit: 2014-01-29 | Payer: Medicare Other | Source: Ambulatory Visit | Admitting: Gastroenterology

## 2014-01-29 ENCOUNTER — Encounter (HOSPITAL_COMMUNITY): Admission: RE | Payer: Self-pay | Source: Ambulatory Visit

## 2014-01-29 HISTORY — DX: Chronic kidney disease, unspecified: N18.9

## 2014-01-29 SURGERY — ESOPHAGOGASTRODUODENOSCOPY (EGD) WITH PROPOFOL
Anesthesia: Monitor Anesthesia Care

## 2014-01-29 NOTE — ED Provider Notes (Signed)
Medical screening examination/treatment/procedure(s) were conducted as a shared visit with non-physician practitioner(s) and myself.  I personally evaluated the patient during the encounter.   EKG Interpretation None      CRITICAL CARE Performed by: Mirian Mo   Total critical care time: 60 minutes  Critical care time was exclusive of separately billable procedures and treating other patients.  Critical care was necessary to treat or prevent imminent or life-threatening deterioration.  Critical care was time spent personally by me on the following activities: development of treatment plan with patient and/or surrogate as well as nursing, discussions with consultants, evaluation of patient's response to treatment, examination of patient, obtaining history from patient or surrogate, ordering and performing treatments and interventions, ordering and review of laboratory studies, ordering and review of radiographic studies, pulse oximetry and re-evaluation of patient's condition.  Briefly, pt is a 80 y.o. male presenting with dyspnea, hypotension, hypoglycemia.  I performed an examination on the patient including cardiac, pulmonary, and gi systems which were remarkable for tachypnea, tachycardia, and generalized abd pain.  He was found to be floridly uroseptic and in septic shock.  Discussed code status and pt was DNR/I without use of pressors.  Admitted to stepdown.     Mirian Mo, MD 01/29/14 [phone]

## 2014-01-30 LAB — RHEUMATOID FACTORS, FLUID: CORTISOL #1 (BASE): NEGATIVE

## 2014-02-01 LAB — CULTURE, BLOOD (ROUTINE X 2)

## 2014-02-03 NOTE — Progress Notes (Signed)
Patient refusing application of condom catheter.

## 2014-02-03 NOTE — Progress Notes (Signed)
Utilization Review Completed.Olene Godfrey T10/26/2015  

## 2014-02-03 NOTE — Progress Notes (Signed)
Pt expired at 1918. Pronounced by 2 RNs. Expected death as pt on comfort care after presenting with septic shock. Death certificate completed and given to RN. Condolences offered to family. Son grateful for care received here at [facility]. Family has personal pastor at bedside.  Jimmye Norman, NP [facility]

## 2014-02-03 NOTE — Progress Notes (Signed)
Nutrition Brief Note  Chart reviewed. Pt now transitioning to comfort care.  No further nutrition interventions warranted at this time.  Please re-consult as needed.   Olufemi Mofield A. Emanie Behan, RD, LDN Pager: [phone] After hours Pager: [phone]  

## 2014-02-03 NOTE — Progress Notes (Signed)
Pt without respirations, pulse, and heart sounds for one full minute. Verified by 2 RN's.  Dawson Bills, RN  Cosign: Obie Dredge, RN

## 2014-02-03 NOTE — Progress Notes (Signed)
Many TEAM 1 - Stepdown/ICU TEAM Progress Note  Alex Wise [id number] DOB: [birth year] DOA: 01/22/2014 PCP: Dorrene German, MD  Admit HPI / Brief Narrative: 79 y.o. male w/ PMH of hypertension, cirrhosis of liver, MI, CKD, stroke, and diabetes, who presented with AMS and generalized weakness. Patient had multiple left rib fractures with large left pleural effusion on 01/24/14. He was adimtted and treated at [facility]. He refused to be evaluated by physical and occupational therapy in the hospital. He also declined SNF for rehabilitation. After he went home on 01/25/14, his condition continued to deteriorate. Per his son, patient had very poor appetite with significantly decreased oral intake. He was very weak, and this had been progressively getting worse to the point he could not stand up and walk.   He was brought to the hospital and was awake, alert and oriented inially, but becames confused later in ED.  He was found to have a UTI by urinalysis. His was severely septic initially, then progressed to septic shock. Aggressive IV fluid was started, without significant improvement. He refused vasopressors. He is DO NOT RESUSCITATE. Patient was admitted for palliative care.  HPI/Subjective: Pt is resting comfortably at the time of my visit.  He has recently been dosed w/ morphine.  Spoke w/ his son at the bedside at length.    Assessment/Plan:  Septic shock due to UTI/pyelonephritis  BP remains quite low - pt appears to be actively dying, but appears comfortable - agree with decision to pursue comfort care - will plan to transfer to [facility] bed later today if pt does not pass away this morning   Lactic acidosis  Due to above  Cryptogenic Cirrhosis of liver w/ thromboctyopenia, recurrent L pleural effusion, esophgeal varices w/ hx of bleeding and hyponatremia   Mass of liver v/s atypical GB architecture   Recent multiple rib fractures after mechanical fall w/ splenic laceration    DM2  Code Status: FULL Family Communication: spoke w/ son at length at bedside  Disposition Plan: comfort care only - expect pt will die in hospital   Consultants: PCCM  Procedures: none  Antibiotics: none  DVT prophylaxis: SCDs  Objective: Blood pressure 45/23, pulse 63, temperature 98 F (36.7 C), temperature source Oral, resp. rate 22, height 5\' 5"  (1.651 m), weight 63.4 kg (139 lb 12.4 oz), SpO2 91.00%.  Intake/Output Summary (Last 24 hours) at 01/06/2014 1029 Last data filed at 01/21/2014 0900  Gross per 24 hour  Intake 2152.5 ml  Output      0 ml  Net 2152.5 ml   Exam: General: No acute respiratory distress Lungs: poor air movement t/o all fields - no wheeze - respirations are not labored  Cardiovascular: Regular rate and rhythm without murmur gallop or rub  Abdomen: moderate ascites, soft, bowel sounds absent, no appreciable mass Extremities: No significant cyanosis, or clubbing;  1+ edema bilateral lower extremities  Data Reviewed: Basic Metabolic Panel:  Recent Labs Lab 01/24/14 0206 01/25/14 0352 01/06/2014 1659 01/13/2014 1728  NA 132* 133* 134* 136*  K 4.2 3.9 4.5 4.3  CL 95* 95* 95* 99  CO2 25 26 23   --   GLUCOSE 162* 105* 75 75  BUN 14 18 26* 26*  CREATININE 1.01 1.01 1.38* 1.40*  CALCIUM 8.2* 8.3* 8.7  --   MG  --  1.7  --   --   PHOS  --  3.4  --   --     Liver Function Tests:  Recent Labs  Lab 01/24/14 0206 01/25/14 0352 01/31/2014 1659  AST 33 26 35  ALT 12 10 12   ALKPHOS 160* 131* 158*  BILITOT 2.9* 3.0* 3.6*  PROT 6.8 6.1 7.3  ALBUMIN 2.0* 1.8* 2.0*    Recent Labs Lab 01/24/14 0206  AMMONIA 30    Coags:  Recent Labs Lab 01/24/14 0206 01/04/2014 1659  INR 1.47 1.48    CBC:  Recent Labs Lab 01/24/14 0206 01/25/14 0352 01/25/14 1030 01/09/2014 1659 01/23/2014 1728  WBC 12.7* 8.2  --  10.8*  --   NEUTROABS 9.8*  --   --  8.6*  --   HGB 12.3* 10.9* 11.9* 15.6 17.0  HCT 35.6* 31.9* 34.3* 45.2 50.0  MCV 100.3* 99.1   --  99.6  --   PLT 110* 88*  --  152  --    Cardiac Enzymes:  Recent Labs Lab 01/24/14 0206 01/14/2014 1659  CKTOTAL 27  --   TROPONINI  --  <0.30   CBG:  Recent Labs Lab 01/26/2014 1621 01/22/2014 1733 01/22/2014 1823 01/14/2014 2059 01/26/2014 2350  GLUCAP 62* 64* 104* 109* 88    Recent Results (from the past 240 hour(s))  AFB CULTURE WITH SMEAR     Status: None   Collection Time    01/24/14  2:45 PM      Result Value Ref Range Status   Specimen Description PLEURAL   Final   Special Requests Normal   Final   Acid Fast Smear     Final   Value: NO ACID FAST BACILLI SEEN     Performed at [facility]   Culture     Final   Value: CULTURE WILL BE EXAMINED FOR 6 WEEKS BEFORE ISSUING A FINAL REPORT     Performed at [facility]   Report Status PENDING   Incomplete  BODY FLUID CULTURE     Status: None   Collection Time    01/24/14  2:45 PM      Result Value Ref Range Status   Specimen Description PLEURAL   Final   Special Requests Normal   Final   Gram Stain     Final   Value: NO WBC SEEN     NO ORGANISMS SEEN     Performed at [facility]   Culture     Final   Value: NO GROWTH 3 DAYS     Performed at [facility]   Report Status 01/19/2014 FINAL   Final  MRSA PCR SCREENING     Status: None   Collection Time    01/25/2014 10:00 PM      Result Value Ref Range Status   MRSA by PCR NEGATIVE  NEGATIVE Final   Comment:            The GeneXpert MRSA Assay (FDA     approved for NASAL specimens     only), is one component of a     comprehensive MRSA colonization     surveillance program. It is not     intended to diagnose MRSA     infection nor to guide or     monitor treatment for     MRSA infections.     Studies:  Recent x-ray studies have been reviewed in detail by the Attending Physician  Scheduled Meds:  Scheduled Meds: . sodium chloride  3 mL Intravenous Q12H    Time spent on care of this patient: 35 mins   Danetra Glock T  , MD  [facility] Office  [phone] Pager - Text Page per Loretha StaplerAmion as per below:  On-Call/Text Page:      [email or web address]      password [email or web address]  If 7PM-7AM, please contact night-coverage [email or web address] Password [email or web address] 01/18/2014, 10:29 AM   LOS: 1 day

## 2014-02-03 NOTE — Progress Notes (Addendum)
Approached by patient's son, Gery Pray.  Gery Pray states that patient now wants medication to raise his BP.  I went to patient's bedside and directly questioned him about his wishes.  Patient stated that he did want medication to raise his BP.  He did not want to have a tube placed down his throat if he were to stop breathing; and did not wish to undergo chest compressions or shocks if his heart were to stop.  NP on call notified and orders received.  Manual BP = 52/46 which correlates to automatic cuff.

## 2014-02-03 NOTE — Consult Note (Signed)
 PULMONARY  / CRITICAL CARE MEDICINE CONSULTATION   Name: Alex Wise MRN: [id number] DOB: May 31, 1931    ADMISSION DATE:  01/16/2014 CONSULTATION DATE: 02/17/2014  REQUESTING CLINICIAN: Dr. Roel Cluck  PRIMARY SERVICE: Hospitalist  CHIEF COMPLAINT:  Hypotension  HISTORY OF PRESENT ILLNESS:    80 y/o man with cirrhosis, EVs, CKD, stroke, admitted w/ fall, rib fractures, effusion, and incidental tumor found on CT imaging. He had had an effusion which was tapped by pulmonology, and over the course of his hospitalization developed progressive hypotension and encephalopathy. Over the past 4 hours, pt has received aggressive fluid boluses with refractory hypotension that has worsened, worsening abdominal pain, and rising lactate. PCCM was consulted regarding use of pressors and central access.  PAST MEDICAL HISTORY :  Past Medical History  Diagnosis Date  . Hypertension   . Cirrhosis of liver 03/28/2012  . Esophageal varices with bleeding(456.0) 03/27/2012  . Myocardial infarction     x 3  . Portal hypertensive gastropathy   . Chronic kidney disease     mild, no nephrologist  . 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 Diabetes mellitus    Past Surgical History  Procedure Laterality Date  . Esophagogastroduodenoscopy  03/27/2012    Procedure: ESOPHAGOGASTRODUODENOSCOPY (EGD);  Surgeon: Inda Castle, MD;  Location: [facility];  Service: Endoscopy;  Laterality: N/A;  bedside  . Esophagogastroduodenoscopy  04/19/2012    Procedure: ESOPHAGOGASTRODUODENOSCOPY (EGD);  Surgeon: Inda Castle, MD;  Location: [facility];  Service: Endoscopy;  Laterality: N/A;  . Esophageal banding  04/19/2012    Procedure: ESOPHAGEAL BANDING;  Surgeon: Inda Castle, MD;  Location: [facility];  Service: Endoscopy;  Laterality: N/A;  . Esophagogastroduodenoscopy N/A 05/22/2012    Procedure: ESOPHAGOGASTRODUODENOSCOPY (EGD);  Surgeon: Inda Castle, MD;  Location: [facility];   Service: Endoscopy;  Laterality: N/A;  . Esophageal banding N/A 05/22/2012    Procedure: ESOPHAGEAL BANDING;  Surgeon: Inda Castle, MD;  Location: [facility];  Service: Endoscopy;  Laterality: N/A;  . For cirrhosis  july 2015    fluid removed for cirrhosis  . Coronary artery bypass graft  1992    x 7   Prior to Admission medications   Medication Sig Start Date End Date Taking? Authorizing Provider  furosemide (LASIX) 20 MG tablet Take 20 mg by mouth daily as needed for fluid.   Yes Historical Provider, MD  lactulose (CHRONULAC) 10 GM/15ML solution Take 30 mLs (20 g total) by mouth daily. 09/06/13  Yes Robbie Lis, MD  nadolol (CORGARD) 40 MG tablet Take 40 mg by mouth every morning.   Yes Historical Provider, MD  pantoprazole (PROTONIX) 40 MG tablet Take 40 mg by mouth 2 (two) times daily.   Yes Historical Provider, MD  spironolactone (ALDACTONE) 50 MG tablet Take 50 mg by mouth daily.   Yes Historical Provider, MD  traZODone (DESYREL) 50 MG tablet Take 50 mg by mouth at bedtime as needed for sleep.    Yes Historical Provider, MD  sulfamethoxazole-trimethoprim (BACTRIM DS) 800-160 MG per tablet Take 1 tablet by mouth daily. 01/25/14   Janece Canterbury, MD   Allergies  Allergen Reactions  . Codeine Nausea And Vomiting    dizziness    FAMILY HISTORY:  Family History  Problem Relation Age of Onset  . Lymphoma Brother   . Lung cancer Father   . Ovarian cancer Sister   . Heart attack Mother    SOCIAL HISTORY:  reports that  he quit smoking about 31 years ago. His smoking use included Cigarettes. He has a 2 pack-year smoking history. He has never used smokeless tobacco. He reports that he does not drink alcohol or use illicit drugs.  REVIEW OF SYSTEMS:  Unable to obtain 2/2 encephalopathy.  SUBJECTIVE: Pt is encephalopathic.  VITAL SIGNS: Temp:  [97.4 F (36.3 C)-98.7 F (37.1 C)] 97.4 F (36.3 C) (10/25 2200) Pulse Rate:  [25-81] 70 (10/26 0030) Resp:  [14-24] 21 (10/26  0045) BP: (44-96)/(20-64) 60/31 mmHg (10/26 0045) SpO2:  [87 %-100 %] 100 % (10/26 0030) Weight:  [130 lb (58.968 kg)-139 lb 12.4 oz (63.4 kg)] 139 lb 12.4 oz (63.4 kg) (10/25 2200) HEMODYNAMICS: Shock physiology, MAP ~30    VENTILATOR SETTINGS:   INTAKE / OUTPUT: Intake/Output     10/25 0701 - 10/26 0700   P.O. 30   I.V. (mL/kg) 642.5 (10.1)   IV Piggyback 1000   Total Intake(mL/kg) 1672.5 (26.4)   Net +1672.5         PHYSICAL EXAMINATION: General:  Chronically ill appearing man, pale Neuro:  Awake, nonsese verbal responses. HEENT:  MMM Neck: thin, flat veins Cardiovascular:  RRR, Lungs:  CTA anteriorly Abdomen:  Distended and tender Musculoskeletal:  Cachectic man Skin: no rashes  LABS:  CBC  Recent Labs Lab 01/24/14 0206 01/25/14 0352 01/25/14 1030 01/29/2014 1659 01/13/2014 1728  WBC 12.7* 8.2  --  10.8*  --   HGB 12.3* 10.9* 11.9* 15.6 17.0  HCT 35.6* 31.9* 34.3* 45.2 50.0  PLT 110* 88*  --  152  --    Coag's  Recent Labs Lab 01/24/14 0206 01/20/2014 1659  INR 1.47 1.48   BMET  Recent Labs Lab 01/24/14 0206 01/25/14 0352 01/03/2014 1659 01/15/2014 1728  NA 132* 133* 134* 136*  K 4.2 3.9 4.5 4.3  CL 95* 95* 95* 99  CO2 25 26 23   --   BUN 14 18 26* 26*  CREATININE 1.01 1.01 1.38* 1.40*  GLUCOSE 162* 105* 75 75   Electrolytes  Recent Labs Lab 01/24/14 0206 01/25/14 0352 01/19/2014 1659  CALCIUM 8.2* 8.3* 8.7  MG  --  1.7  --   PHOS  --  3.4  --    Sepsis Markers  Recent Labs Lab 01/21/2014 1659 01/09/2014 2312  LATICACIDVEN 6.1* 5.7*   ABG No results found for this basename: PHART, PCO2ART, PO2ART,  in the last 168 hours Liver Enzymes  Recent Labs Lab 01/24/14 0206 01/25/14 0352 01/11/2014 1659  AST 33 26 35  ALT 12 10 12   ALKPHOS 160* 131* 158*  BILITOT 2.9* 3.0* 3.6*  ALBUMIN 2.0* 1.8* 2.0*   Cardiac Enzymes  Recent Labs Lab 01/06/2014 1659  TROPONINI <0.30   Glucose  Recent Labs Lab 01/25/14 0857  1621  01/18/2014 1733 01/11/2014 1823 01/03/2014 2059 01/16/2014 2350  GLUCAP 102* 62* 64* 104* 109* 88    Imaging Dg Chest Portable 1 View  01/10/2014   CLINICAL DATA:  Hypotension and loss of consciousness  EXAM: PORTABLE CHEST - 1 VIEW  COMPARISON:  01/24/2014 chest radiograph.  Chest CT the 01/24/2014  FINDINGS: Multiple skin folds project over the left laterally. There are changes of median sternotomy for CABG. Heart and mediastinal contours are stable. There are low lung volumes bilaterally. There is diffuse interstitial prominence bilaterally. Improved aeration of the left lung base compared to chest radiograph dated 01/24/2014.  Small bilateral pleural effusions, left greater than right. Diffuse osteopenia. Several left-sided rib fractures are seen. The patient's  rib fractures are best demonstrated on recent chest CT.  IMPRESSION: Improved aeration in the left lung base compared to recent prior chest radiograph. Small bilateral pleural effusions.  Prominent interstitial markings bilaterally may be due to chronic interstitial lung disease when correlated with recent chest CT.   Electronically Signed   By: Curlene Dolphin M.D.   On: 01/18/2014 17:28    ASSESSMENT / PLAN:   80 y/o man with cirrhosis, concern for new malignancy on imaging. Pt had previously clearly expressed his wishes for DNR/DNI status. Given his clinical situation with underlying organ failure now with progressive shock despite IVF and rising lactate, the patient is actively dying. I met with the patient's ex-wife, girlfriend, and son (via phone) along with a Erin Hearing, NP (from the primary team) and outlined the patient's situation and poor prognosis. I reccommended against central line placement and trial of critical care in favor of comfort measures. The family agreed that the patient would not want to undergo such measures as central line placement or continuation of life-prolonging therapy.    I have personally obtained a  history, examined the patient, evaluated laboratory and imaging results, formulated the assessment and plan and placed orders.  CRITICAL CARE: The patient is critically ill with multiple organ systems failure and requires high complexity decision making for assessment and support, frequent evaluation and titration of therapies, application of advanced monitoring technologies and extensive interpretation of multiple databases. Critical Care Time devoted to patient care services described in this note is 30 minutes.   Luz Brazen, MD Pulmonary & Critical Care Medicine 02/02/2014, 1:10 AM   February 02, 2014, 12:58 AM

## 2014-02-03 DEATH — deceased

## 2014-02-04 NOTE — Discharge Summary (Signed)
Death Summary  PRESTIN KUPERMAN PVX:[id number] DOB: 18-Oct-1931 DOA: February 22, 2014  PCP: Dorrene German, MD  Admit date: 02-22-2014 Date of Death: 23-Feb-2014  Final Diagnoses:  Septic shock due to UTI/pyelonephritis  Lactic acidosis  Cryptogenic Cirrhosis of liver w/ thromboctyopenia, recurrent L pleural effusion, esophgeal varices w/ hx of bleeding and hyponatremia  Mass of liver v/s atypical GB architecture  Recent multiple rib fractures after mechanical fall w/ splenic laceration  DM2  History of present illness:  80 y.o. male w/ PMH of hypertension, cirrhosis of liver, MI, CKD, stroke, and diabetes, who presented with AMS and generalized weakness. Patient had multiple left rib fractures with large left pleural effusion on 01/24/14. He was adimtted and treated at [facility] He refused to be evaluated by physical and occupational therapy in the hospital. He also declined SNF for rehabilitation. After he went home on 01/25/14, his condition continued to deteriorate. Per his son, patient had very poor appetite with significantly decreased oral intake. He was very weak, and this had been progressively getting worse to the point he could not stand up and walk.   He was brought back to the hospital February 23, 2023 and was awake, alert and oriented inially, but became confused later while still in the ED.  Hospital Course:   He was found to have a UTI by urinalysis. He was severely septic initially, then progressed to septic shock. Aggressive IV fluid resuscitation was administered, without significant improvement. He refused vasopressors. He was DO NOT RESUSCITATE. It became clear that our interventions were not impacting his course, and the decision was made with support of the family and in keeping with the patients clear wished to pursue comfort care only.    Pt expired at 1918 on 02/23/2014 while receiving inpatient comfort directed care.  His family was at the bedside at the time of his  death.  SignedJetty Duhamel T  [facility] 02/04/2014, 6:58 PM

## 2014-02-12 ENCOUNTER — Inpatient Hospital Stay: Payer: Medicare Other | Admitting: Adult Health

## 2014-03-08 LAB — AFB CULTURE WITH SMEAR (NOT AT ARMC)
ACID FAST SMEAR: NONE SEEN
Special Requests: NORMAL

## 2016-09-22 IMAGING — CR DG LUMBAR SPINE COMPLETE 4+V
5 series · 5 of 5 positions shown · non-contrast
Comparison: None.

CLINICAL DATA: Patient fell 3 days ago with severe lower back pain.

EXAM:
LUMBAR SPINE - COMPLETE 4+ VIEW

[t lumbar spine ap]
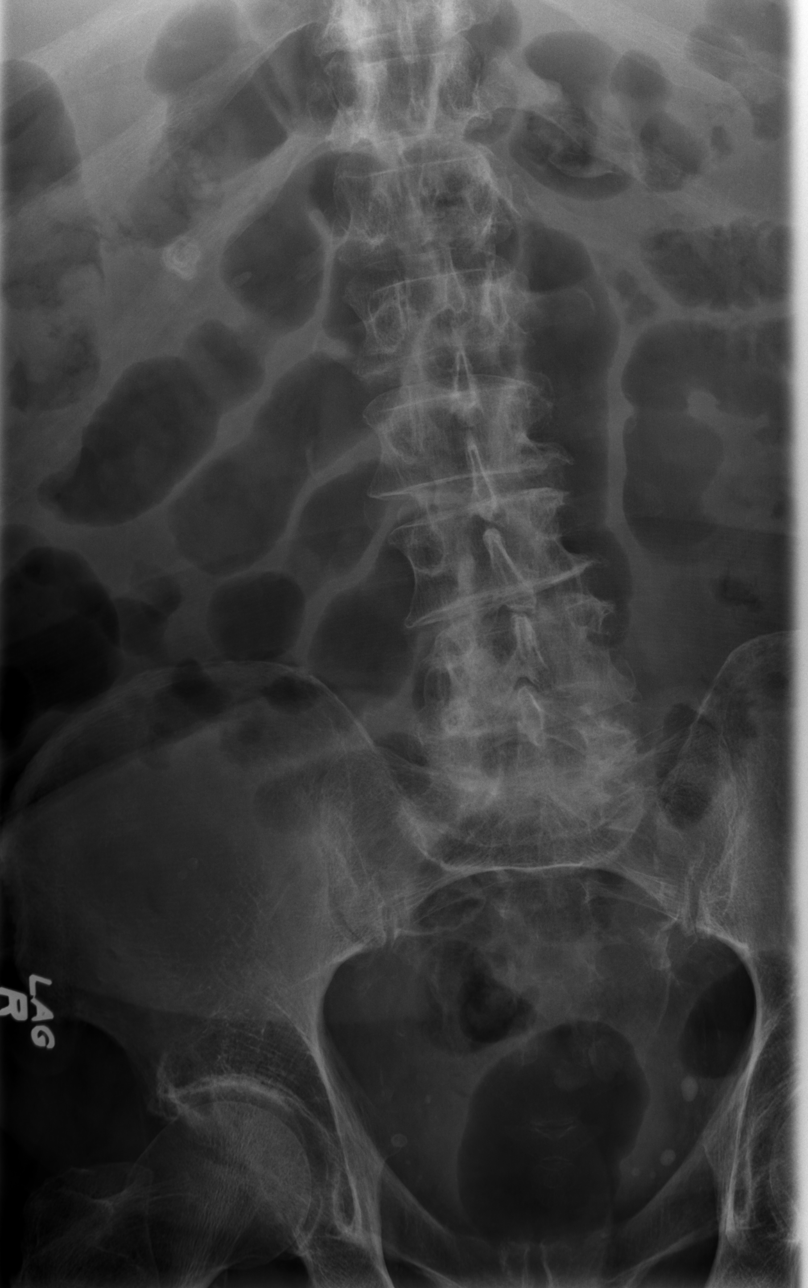

[t lumbar spine obl (1 of 2)]
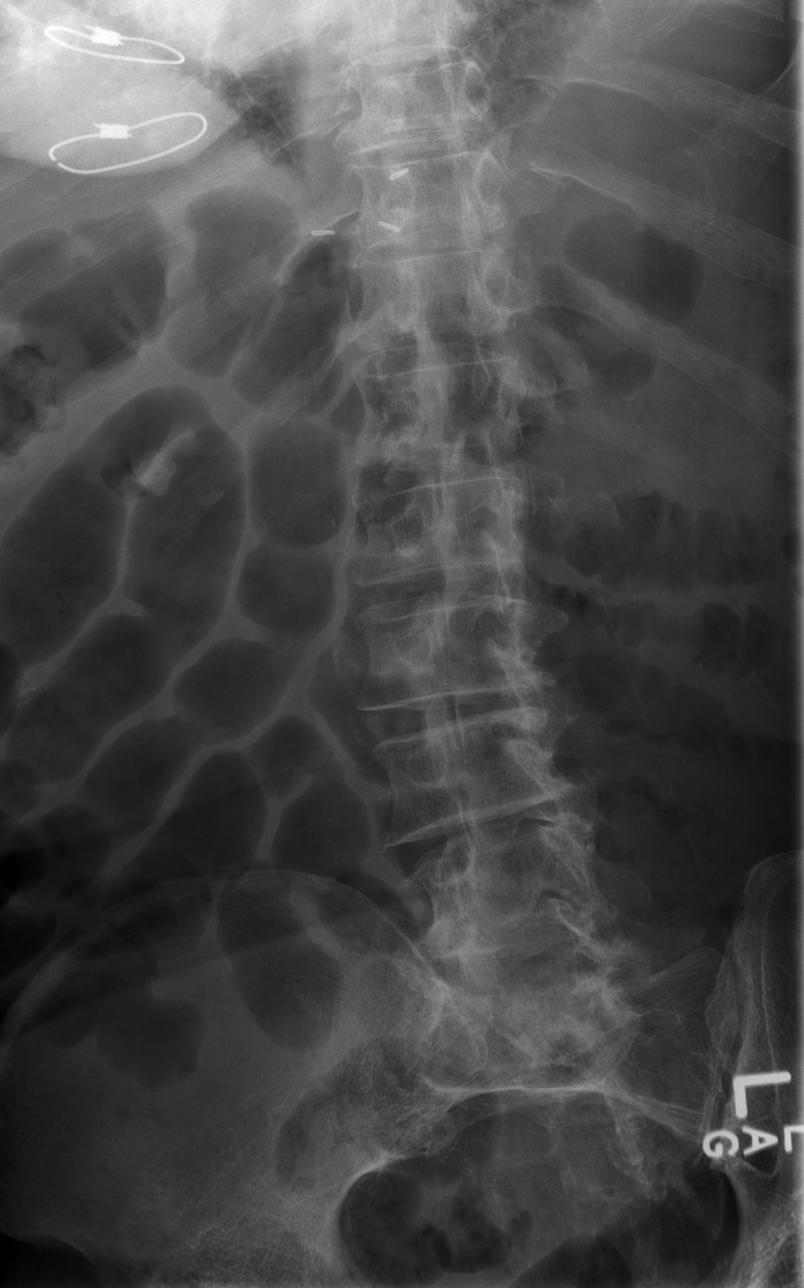

[t lumbar spine obl (2 of 2)]
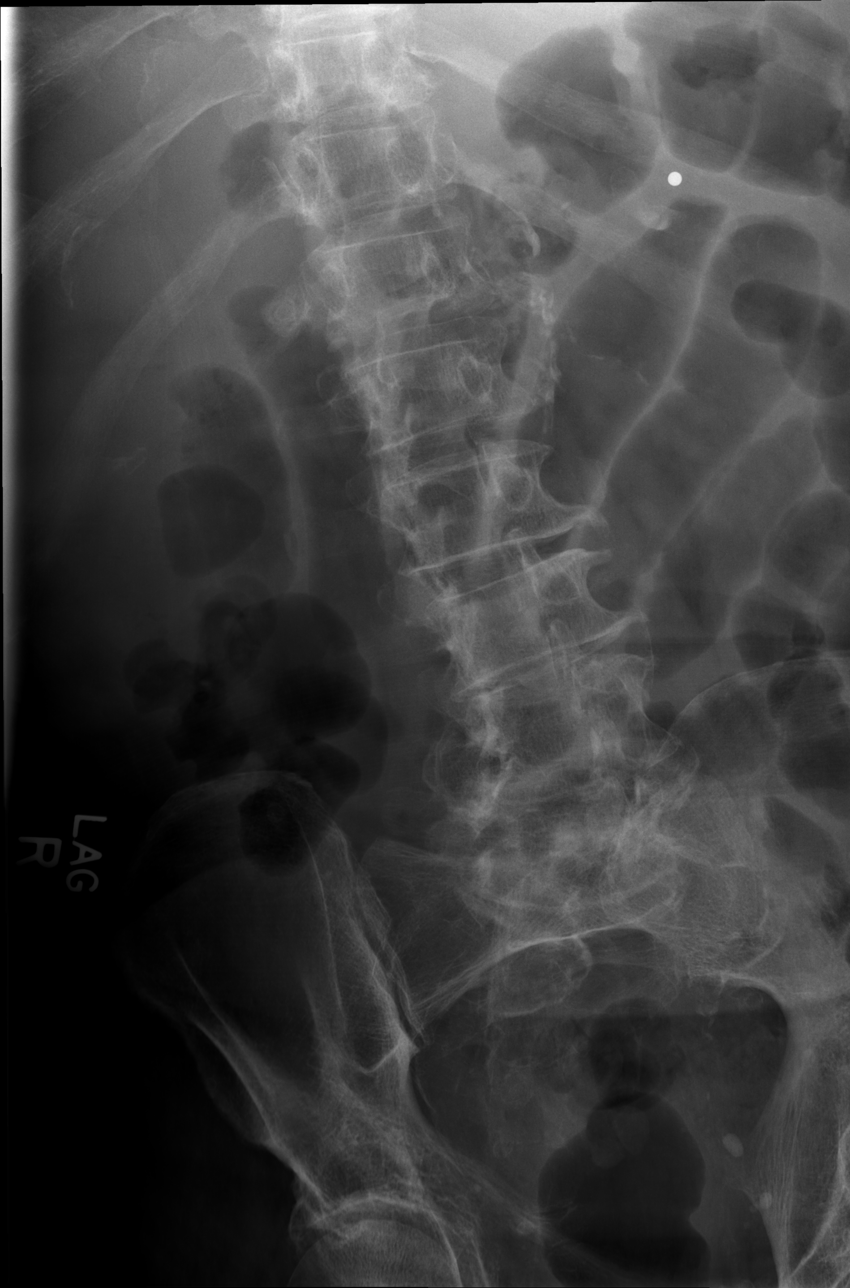

[w lumbar spine lat (1 of 2)]
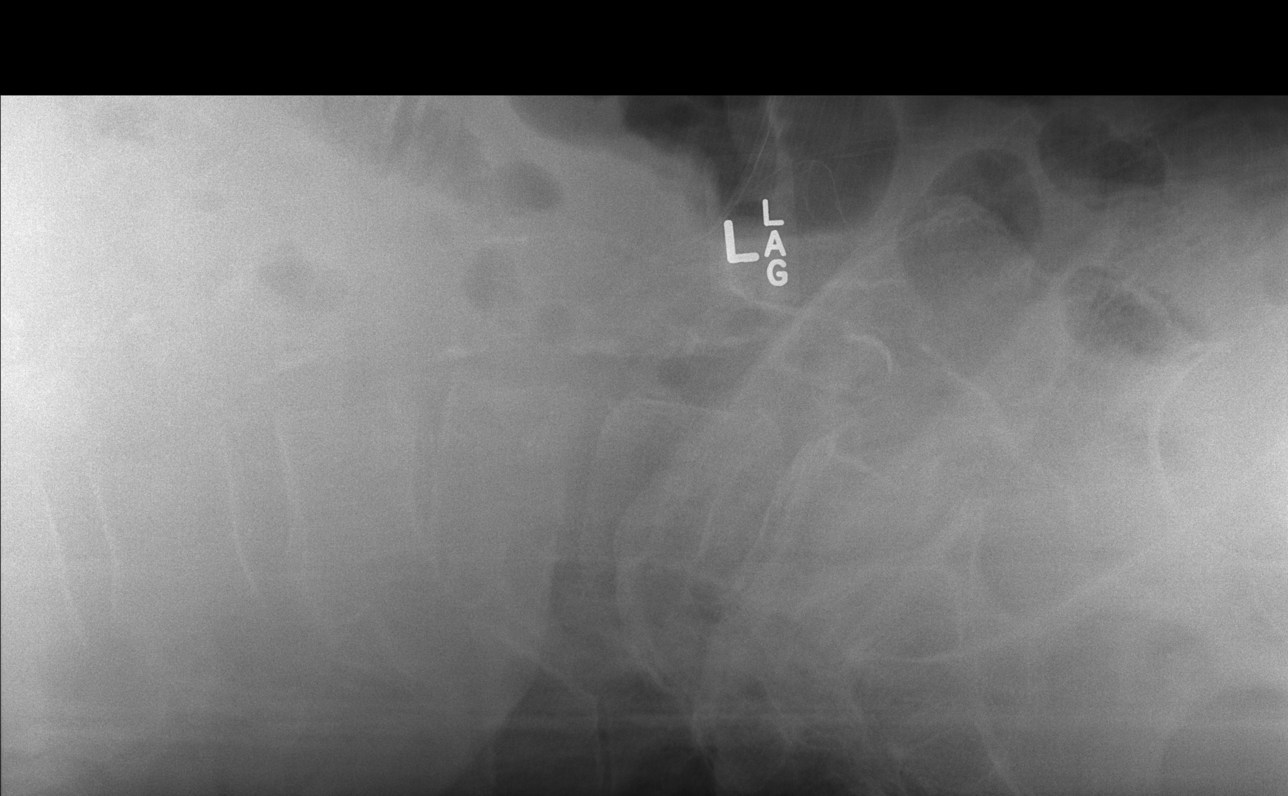

[w lumbar spine lat (2 of 2)]
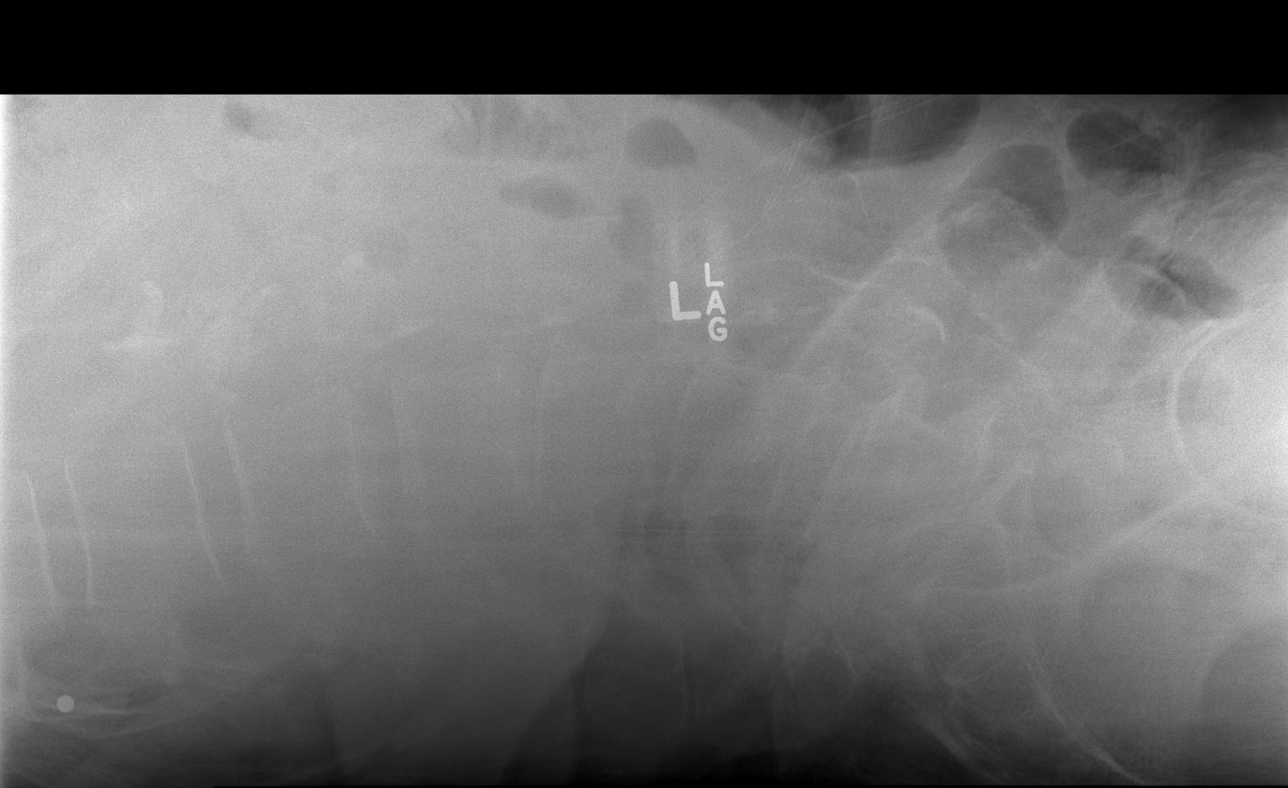

[5 of 5 positions shown; findings below may reference images not displayed]

FINDINGS: Degenerative changes throughout the lumbar spine. Narrowed lumbar
interspaces and associated endplate hypertrophic changes. Normal
alignment of the facet joints. Normal alignment of the lumbar
vertebrae. No vertebral compression deformities. Vascular
calcifications in the abdominal aorta. Calcification in the right
upper quadrant consistent with gallstone. Gas-filled bowel loops
without distention, suggesting mild ileus.
IMPRESSION: Degenerative changes in the lumbar spine. No displaced acute
fractures demonstrated.
